# Patient Record
Sex: Female | Born: 1970 | Race: White | Hispanic: No | Marital: Married | Smoking: Current every day smoker
Health system: Southern US, Community
[De-identification: ages and names within clinical notes are randomized; demographics above are authoritative.]

## PROBLEM LIST (undated history)

## (undated) DIAGNOSIS — M549 Dorsalgia, unspecified: Secondary | ICD-10-CM

## (undated) DIAGNOSIS — M1990 Unspecified osteoarthritis, unspecified site: Secondary | ICD-10-CM

## (undated) DIAGNOSIS — F329 Major depressive disorder, single episode, unspecified: Secondary | ICD-10-CM

## (undated) DIAGNOSIS — F419 Anxiety disorder, unspecified: Secondary | ICD-10-CM

## (undated) DIAGNOSIS — F32A Depression, unspecified: Secondary | ICD-10-CM

## (undated) HISTORY — PX: APPENDECTOMY: SHX54

## (undated) HISTORY — PX: DILATION AND CURETTAGE OF UTERUS: SHX78

## (undated) HISTORY — PX: TUBAL LIGATION: SHX77

## (undated) HISTORY — PX: COLONOSCOPY: SHX174

## (undated) HISTORY — PX: CHOLECYSTECTOMY: SHX55

## (undated) HISTORY — PX: WISDOM TOOTH EXTRACTION: SHX21

---

## 2017-02-17 ENCOUNTER — Emergency Department (HOSPITAL_COMMUNITY): Payer: BLUE CROSS/BLUE SHIELD

## 2017-02-17 ENCOUNTER — Encounter (HOSPITAL_COMMUNITY): Payer: Self-pay

## 2017-02-17 ENCOUNTER — Emergency Department (HOSPITAL_COMMUNITY)
Admission: EM | Admit: 2017-02-17 | Discharge: 2017-02-17 | Disposition: A | Discharge status: Home / Self Care | Payer: BLUE CROSS/BLUE SHIELD | Attending: Emergency Medicine | Admitting: Emergency Medicine

## 2017-02-17 DIAGNOSIS — F17200 Nicotine dependence, unspecified, uncomplicated: Secondary | ICD-10-CM | POA: Insufficient documentation

## 2017-02-17 DIAGNOSIS — M545 Low back pain: Secondary | ICD-10-CM | POA: Diagnosis present

## 2017-02-17 DIAGNOSIS — M5441 Lumbago with sciatica, right side: Secondary | ICD-10-CM | POA: Diagnosis not present

## 2017-02-17 HISTORY — DX: Dorsalgia, unspecified: M54.9

## 2017-02-17 MED ORDER — HYDROMORPHONE HCL 1 MG/ML IJ SOLN
1.0000 mg | Freq: Once | INTRAMUSCULAR | Status: AC
  Administered 2017-02-17: 1 mg via INTRAVENOUS

## 2017-02-17 MED ORDER — HYDROMORPHONE HCL 1 MG/ML IJ SOLN
1.0000 mg | Freq: Once | INTRAMUSCULAR | Status: DC
  Filled 2017-02-17: qty 1

## 2017-02-17 MED ORDER — CARISOPRODOL 350 MG PO TABS: 350.0000 mg | ORAL_TABLET | Freq: Three times a day (TID) | ORAL | 0 refills | Status: DC

## 2017-02-17 MED ORDER — DICLOFENAC 18 MG PO CAPS: 1.0000 | ORAL_CAPSULE | Freq: Two times a day (BID) | ORAL | 0 refills | Status: DC | PRN

## 2017-02-17 NOTE — ED Notes (Signed)
Pt returned from MRI and placed back on monitor.  

## 2017-02-17 NOTE — ED Triage Notes (Signed)
Patient complains of ongoing chronic back pain and increased pain to right leg. Has been seeing neuro surgery but no surgery planned as of yet. No new trauma

## 2017-02-17 NOTE — ED Notes (Addendum)
Pt reports issues with lower back. She sates she is in the process of setting up surgery but within the last month her back pain has gotten worse. Pt reports S1, S3-S5. Pt reports issues with bowel incontinence. She also reports 4 falls since February. C/O sharp stabbing pain in right thigh X3 weeks. She reports today at work she had this sharp stabbing pain in her right leg and it contracted backwards. She reports she spoke with MD last Tuesday and is getting MRI and will then get surgery.

## 2017-02-17 NOTE — Consult Note (Signed)
CC:  Chief Complaint  Patient presents with  . back pain/ leg pain    HPI: Kathleen Hansen is a 46 y.o. female presents to ER complaining of continued back pain with radiation down right leg. She was seen in clinic 3/21 by myself and Dr Conchita ParisNundkumar. She reports the pain has not worsened since her office visit. She continues to endorse right leg weakness but denies falls. She does state she has difficulties with urinary incontinence, but not all the time. She denies rectal paresthesia/fecal incontinence. She is currently taking Gabapenin 600mg  QID, Mobic 15mg  daily. She just completed a round of steroids which helped slightly. Her MRI from August 2017 showed L4-5 severe facet arthropathy with anterolisthesis. Moderate canal stenosis with bl L5 impingement in the subarticular recesses. L5-S1 left paracentral disc protrusion with S1 impingement. At her last OV, an MRI and surgery was scheduled - still pending insurance approval.   PMH: Past Medical History:  Diagnosis Date  . Back pain     PSH: History reviewed. No pertinent surgical history.  SH: Social History  Substance Use Topics  . Smoking status: Current Every Day Smoker  . Smokeless tobacco: Never Used  . Alcohol use Not on file    MEDS: Prior to Admission medications   Not on File    ALLERGY: No Known Allergies  ROS: Review of Systems  Constitutional: Negative for chills, fever and weight loss.  HENT: Negative for hearing loss and tinnitus.   Eyes: Negative.   Cardiovascular: Negative.   Gastrointestinal: Positive for nausea (from pain). Negative for constipation, diarrhea, heartburn and vomiting.  Genitourinary: Negative.   Musculoskeletal: Positive for back pain and myalgias. Negative for falls and neck pain.  Skin: Negative.   Neurological: Positive for sensory change (right thigh). Negative for dizziness, tingling, tremors, speech change, focal weakness, seizures, loss of consciousness and headaches.   Endo/Heme/Allergies: Does not bruise/bleed easily.    Vitals:   02/17/17 1217 02/17/17 1422  BP: 135/77 123/77  Pulse: 91 88  Resp: 16 13  Temp: 98.5 F (36.9 C)    General appearance: NAD, resting comfortably Eyes: PERRL, Fundoscopic: normal Cardiovascular: Regular rate and rhythm without murmurs, rubs, gallops. No edema or variciosities. Distal pulses normal. Pulmonary: Clear to auscultation Musculoskeletal:     Muscle tone upper extremities: Normal    Muscle tone lower extremities: Normal    Motor exam: Upper Extremities Deltoid Bicep Tricep Grip  Right 5/5 5/5 5/5 5/5  Left 5/5 5/5 5/5 5/5   Lower Extremity IP Quad PF DF EHL  Right 4+/5 4+/5 4+/5 4+/5 4+/5  Left 5/5 5/5 5/5 5/5 5/5   Neurological Awake, alert, oriented Memory and concentration grossly intact Speech fluent, appropriate CNII: Visual fields normal CNIII/IV/VI: EOMI CNV: Facial sensation normal CNVII: Symmetric, normal strength CNVIII: Grossly normal CNIX: Normal palate movement CNXI: Trap and SCM strength normal CN XII: Tongue protrusion normal Sensation grossly intact to LT DTR: Normal  IMAGING: No recent imaging other than MRI mentioned in HPI  IMPRESSION/PLAN - 46 y.o. female with chronic low back pain with radiculopathy. She unfortunately is in a difficult circumstance where she is still pending approval from insurance for her L-spine MRI and surgery. Based on level of pain and episodes of urinary incontinence, recommend stat MRI L-spine w/o to r/o cauda equina syndrome. Patient was advised that if there is no evidence of CES, that we could trial her on Soma and diclofenac (d/c Mobic), but otherwise, she would be discharged home. She was very understanding  and agreed to this.

## 2017-02-17 NOTE — ED Notes (Signed)
Pt transported to MRI 

## 2017-02-17 NOTE — ED Notes (Signed)
Neuro at bedside.

## 2017-02-17 NOTE — ED Notes (Signed)
Changed dilaudid from IM to IV [facility]

## 2017-02-17 NOTE — ED Notes (Signed)
Wheeled pt back to room from waiting room, pt placed in gown. 

## 2017-02-17 NOTE — ED Provider Notes (Signed)
[facility]-EMERGENCY DEPT Provider Note   CSN: [id number] Arrival date & time: 02/17/17  1204   By signing my name below, I, Kathleen Hansen, attest that this documentation has been prepared under the direction and in the presence of Kathleen Memos, MD. Electronically signed, Kathleen Hansen, ED Scribe. 02/17/17. 4:15 PM.  History   Chief Complaint Chief Complaint  Patient presents with  . back pain/ leg pain   The history is provided by the patient and medical records. No language interpreter was used.    HPI Comments: Kathleen Hansen is a 46 y.o. female who presents to the Emergency Department complaining of 10/10 worsened, constant, chronic back pain x 1 month. She notes associated bowel incontinence, RLE numbness, RLE weakness and 4 falls since 12/2016. Pt reports new, sharp, stabbing and burning pain radiating from the R thigh to the middle of the buttocks, and she adds some R leg cramping and spasms. She states she was seen by a neurology specialist 02/08/2017, but she has not had surgery scheduled yet. She further adds she hs used a TENS unit, done physical therapy exercises and used prescribed steroidal medications without medications. Pt denies recent traumas.  Past Medical History:  Diagnosis Date  . Back pain     There are no active problems to display for this patient.   History reviewed. No pertinent surgical history.  OB History    No data available       Home Medications    Prior to Admission medications   Medication Sig Start Date End Date Taking? Authorizing Provider  carisoprodol (SOMA) 350 MG tablet Take 1 tablet (350 mg total) by mouth 3 (three) times daily. 02/17/17   Kathleen Memos, MD  Diclofenac 18 MG CAPS Take 1 capsule by mouth 2 (two) times daily as needed. 02/17/17   Kathleen Memos, MD    Family History No family history on file.  Social History Social History  Substance Use Topics  . Smoking status: Current Every Day Smoker  . Smokeless tobacco: Never Used    . Alcohol use Not on file     Allergies   Patient has no known allergies.   Review of Systems Review of Systems  Gastrointestinal:       +bowel incontinece  Musculoskeletal: Positive for arthralgias and back pain. Negative for gait problem and joint swelling.  Skin: Negative for wound.  Neurological: Positive for weakness and numbness.  All other systems reviewed and are negative.    Physical Exam Updated Vital Signs BP 123/77 (BP Location: Right Arm)   Pulse 88   Temp 98.5 F (36.9 C) (Oral)   Resp 13   Ht 5\' 2"  (1.575 m)   Wt 241 lb (109.3 kg)   SpO2 97%   BMI 44.08 kg/m   Physical Exam  Constitutional: She is oriented to person, place, and time. She appears well-developed and well-nourished.  HENT:  Head: Normocephalic and atraumatic.  Eyes: Conjunctivae are normal. Pupils are equal, round, and reactive to light. Right eye exhibits no discharge. Left eye exhibits no discharge. No scleral icterus.  Neck: Normal range of motion. No JVD present. No tracheal deviation present.  Cardiovascular: Normal rate and regular rhythm.  Exam reveals no friction rub.   No murmur heard. Pulmonary/Chest: Effort normal and breath sounds normal. No stridor. No respiratory distress.  Musculoskeletal: She exhibits tenderness. She exhibits no deformity.  No midline spinal tenderness; no paraspinal TTP; no step off or deformities; no T or L spine tenderness; NL sensation of  bilateral lower extremities except for mild decreased sensitivity to the R medial thigh; pt has more difficulty lifting the R leg, but she is able to hold it up for ~5 seconds; NL achilles reflex on the R  Neurological: She is alert and oriented to person, place, and time. Coordination normal.  Decreased ability to hold her leg against gravity on the right side. Normal deep tendon reflexes in the right Achilles. Normal DP pulse on the right. Decreased sensation right medial thigh compared to the left. Upper extremity is  normal.  Skin: Skin is warm and dry.  Psychiatric: She has a normal mood and affect. Her behavior is normal. Judgment and thought content normal.  Nursing note and vitals reviewed.    ED Treatments / Results  DIAGNOSTIC STUDIES: Oxygen Saturation is 97% on RA, normal by my interpretation.    COORDINATION OF CARE: 4:02 PM Discussed treatment plan with pt at bedside and pt agreed to plan. Will order medication.  Labs (all labs ordered are listed, but only abnormal results are displayed) Labs Reviewed - No data to display  EKG  EKG Interpretation None       Radiology Mr Lumbar Spine Wo Contrast  Result Date: 02/17/2017 CLINICAL DATA:  Low back and RIGHT leg pain. Evaluate for cauda equina impingement. Episodes of incontinence. EXAM: MRI LUMBAR SPINE WITHOUT CONTRAST TECHNIQUE: Multiplanar, multisequence MR imaging of the lumbar spine was performed. No intravenous contrast was administered. COMPARISON:  Lumbar flexion extension radiographs 02/09/2017 at [facility]. MRI lumbar spine 07/06/2016. FINDINGS: Segmentation:  Standard. Alignment:  2 mm retrolisthesis L2-3.  1 mm anterolisthesis L4-5. Vertebrae:  No worrisome osseous lesion. Conus medullaris: Extends to the L1 level and appears normal. Paraspinal and other soft tissues: 13 mm suspected LEFT adrenal adenoma better seen on the previous exam Disc levels: L1-L2:  Normal disc space.  No impingement. L2-L3: Disc space narrowing. 2 mm retrolisthesis. Annular bulge. Mild facet arthropathy. No impingement. L3-L4:  Normal disc space.  Mild facet arthropathy.  No impingement. L4-L5: 1 mm anterolisthesis. Advanced facet arthropathy with large joint effusions. Ligamentum flavum hypertrophy. Annular bulging/ uncovering of the disc. Moderate stenosis. BILATERAL subarticular zone and borderline foraminal zone narrowing affecting the L5 greater than L4 nerve roots. These changes are not clearly worse on the RIGHT versus LEFT. L5-S1: Disc desiccation. Central  and leftward protrusion. Mild facet arthropathy. LEFT L5 and LEFT S1 nerve root impingement are noted. Compared with 2017 study, the disc protrusion at L5-S1 is worse, extending into the foramen. The appearance at L4-5 is essentially the same. Compared with the standing flexion extension views, anterolisthesis at L4-5 increases 10 mm. Given that degree of slip, and forward flexion, severe stenosis due to dynamic instability would be expected at the L4-5 level. IMPRESSION: 1 mm anterolisthesis L4-5 secondary to severe posterior element hypertrophy. Annular bulge/ uncovering of the disc. Note is made of significant dynamic instability with standing flexion extension views, with increased anterolisthesis L4-5 up to 10 mm. Therefore, standing could result in significant cauda equina compression which is not present on this static supine examination. Central and leftward protrusion L5-S1, worse from 2017. Now LEFT L5, and LEFT S1 nerve root impingement are noted. 2 mm retrolisthesis L2-3 also facet mediated, without impingement. Electronically Signed   By: Elsie Stain M.D.   On: 02/17/2017 19:30    Procedures Procedures (including critical care time)  Medications Ordered in ED Medications  HYDROmorphone (DILAUDID) injection 1 mg (1 mg Intravenous Given 02/17/17 1629)  Initial Impression / Assessment and Plan / ED Course  I have reviewed the triage vital signs and the nursing notes.  Pertinent labs & imaging results that were available during my care of the patient were reviewed by me and considered in my medical decision making (see chart for details).     MRI done per neurosurgery's request. Findings and MRI were directly discussed with neurosurgery physician assistant who stated that he had discussed the case and MRI with the on-call neurosurgeon, Dr. Bevely Palmeritty, and the neurosurgeon felt that these I&D in this were consistent with her chronic findings and were similar to previous MRI. He did not  suggest admission, emergent surgery or brace. They were still plan on doing outpatient surgery which they will talk to her about setting up. I discussed this with the patient and she is okay with that plan and will follow up as an outpatient.  Final Clinical Impressions(s) / ED Diagnoses   Final diagnoses:  Acute right-sided low back pain with right-sided sciatica    New Prescriptions Discharge Medication List as of 02/17/2017  8:14 PM    START taking these medications   Details  carisoprodol (SOMA) 350 MG tablet Take 1 tablet (350 mg total) by mouth 3 (three) times daily., Starting Thu 02/17/2017, Print    Diclofenac 18 MG CAPS Take 1 capsule by mouth 2 (two) times daily as needed., Starting Thu 02/17/2017, Print       I personally performed the services described in this documentation, which was scribed in my presence. The recorded information has been reviewed and is accurate.     Kathleen MemosJason Carling Liberman, MD 02/18/17 1318

## 2017-02-17 NOTE — ED Notes (Signed)
ED Provider at bedside. 

## 2017-02-21 ENCOUNTER — Other Ambulatory Visit: Payer: Self-pay | Admitting: Neurosurgery

## 2017-02-24 ENCOUNTER — Other Ambulatory Visit (HOSPITAL_COMMUNITY): Payer: Self-pay | Admitting: Neurosurgery

## 2017-02-24 DIAGNOSIS — M4316 Spondylolisthesis, lumbar region: Secondary | ICD-10-CM

## 2017-03-04 ENCOUNTER — Ambulatory Visit (HOSPITAL_COMMUNITY)
Admission: RE | Admit: 2017-03-04 | Discharge: 2017-03-04 | Disposition: A | Discharge status: Home / Self Care | Payer: BLUE CROSS/BLUE SHIELD | Source: Ambulatory Visit | Attending: Neurosurgery | Admitting: Neurosurgery

## 2017-03-04 DIAGNOSIS — M48061 Spinal stenosis, lumbar region without neurogenic claudication: Secondary | ICD-10-CM | POA: Diagnosis not present

## 2017-03-04 DIAGNOSIS — M4316 Spondylolisthesis, lumbar region: Secondary | ICD-10-CM | POA: Insufficient documentation

## 2017-03-04 DIAGNOSIS — M2578 Osteophyte, vertebrae: Secondary | ICD-10-CM | POA: Insufficient documentation

## 2017-03-08 ENCOUNTER — Encounter (HOSPITAL_COMMUNITY): Payer: Self-pay | Admitting: *Deleted

## 2017-03-08 ENCOUNTER — Encounter (HOSPITAL_COMMUNITY)
Admission: RE | Admit: 2017-03-08 | Discharge: 2017-03-08 | Disposition: A | Discharge status: Home / Self Care | Payer: BLUE CROSS/BLUE SHIELD | Source: Ambulatory Visit | Attending: Neurosurgery | Admitting: Neurosurgery

## 2017-03-08 DIAGNOSIS — M1990 Unspecified osteoarthritis, unspecified site: Secondary | ICD-10-CM | POA: Diagnosis not present

## 2017-03-08 DIAGNOSIS — F329 Major depressive disorder, single episode, unspecified: Secondary | ICD-10-CM | POA: Diagnosis not present

## 2017-03-08 DIAGNOSIS — Z01812 Encounter for preprocedural laboratory examination: Secondary | ICD-10-CM | POA: Diagnosis not present

## 2017-03-08 DIAGNOSIS — M549 Dorsalgia, unspecified: Secondary | ICD-10-CM | POA: Insufficient documentation

## 2017-03-08 DIAGNOSIS — F419 Anxiety disorder, unspecified: Secondary | ICD-10-CM | POA: Diagnosis not present

## 2017-03-08 HISTORY — DX: Major depressive disorder, single episode, unspecified: F32.9

## 2017-03-08 HISTORY — DX: Depression, unspecified: F32.A

## 2017-03-08 HISTORY — DX: Anxiety disorder, unspecified: F41.9

## 2017-03-08 HISTORY — DX: Unspecified osteoarthritis, unspecified site: M19.90

## 2017-03-08 LAB — BASIC METABOLIC PANEL
ANION GAP: 11 (ref 5–15)
BUN: <5 mg/dL — ABNORMAL LOW (ref 6–20)
CHLORIDE: 103 mmol/L (ref 101–111)
CO2: 26 mmol/L (ref 22–32)
Calcium: 9.1 mg/dL (ref 8.9–10.3)
Creatinine, Ser: 0.74 mg/dL (ref 0.44–1.00)
GFR calc Af Amer: >60 mL/min (ref >60)
GFR calc non Af Amer: >60 mL/min (ref >60)
Glucose, Bld: 98 mg/dL (ref 65–99)
POTASSIUM: 3.8 mmol/L (ref 3.5–5.1)
SODIUM: 140 mmol/L (ref 135–145)

## 2017-03-08 LAB — CBC
HCT: 43.9 % (ref 36.0–46.0)
HEMOGLOBIN: 14.8 g/dL (ref 12.0–15.0)
MCH: 31.7 pg (ref 26.0–34.0)
MCHC: 33.7 g/dL (ref 30.0–36.0)
MCV: 94 fL (ref 78.0–100.0)
Platelets: 352 10*3/uL (ref 150–400)
RBC: 4.67 MIL/uL (ref 3.87–5.11)
RDW: 13.5 % (ref 11.5–15.5)
WBC: 8.6 10*3/uL (ref 4.0–10.5)

## 2017-03-08 LAB — TYPE AND SCREEN
ABO/RH(D): A POS
ANTIBODY SCREEN: NEGATIVE

## 2017-03-08 LAB — SURGICAL PCR SCREEN
MRSA, PCR: NEGATIVE
Staphylococcus aureus: NEGATIVE

## 2017-03-08 LAB — ABO/RH: ABO/RH(D): A POS

## 2017-03-08 LAB — HCG, SERUM, QUALITATIVE: Preg, Serum: NEGATIVE

## 2017-03-08 NOTE — Progress Notes (Signed)
PCP - [facility]  Cardiologist - denies  Chest x-ray - not needed EKG - no heart history Stress Test - denies ECHO - denies Cardiac Cath - denies      Patient denies shortness of breath, fever, cough and chest pain at PAT appointment   Patient verbalized understanding of instructions that was given to them at the PAT appointment. Patient expressed that there were no further questions.  Patient was also instructed that they will need to review over the PAT instructions again at home before the surgery.

## 2017-03-08 NOTE — Pre-Procedure Instructions (Signed)
Kathleen Hansen  03/08/2017      [id number] - [place], [place] - [address] AT [facility] [address] [place] [address] Phone: [phone] Fax: [phone]    Your procedure is scheduled on April 24  Report to [facility] Admitting at [employer] A.M.  Call this number if you have problems the morning of surgery:  [phone]   Remember:  Do not eat food or drink liquids after midnight.   Take these medicines the morning of surgery with A SIP OF WATER DULoxetine (CYMBALTA), gabapentin (NEURONTIN), eye drops if needed, sertraline (ZOLOFT)   Take all other medications as prescribed except 7 days prior to surgery STOP taking any MOBIC, Aspirin, Aleve, Naproxen, Ibuprofen, Motrin, Advil, Goody's, BC's, all herbal medications, fish oil, and all vitamins    Do not wear jewelry, make-up or nail polish.  Do not wear lotions, powders, or perfumes, or deoderant.  Do not shave 48 hours prior to surgery.  Men may shave face and neck.  Do not bring valuables to the hospital.  [facility] is not responsible for any belongings or valuables.  Contacts, dentures or bridgework may not be worn into surgery.  Leave your suitcase in the car.  After surgery it may be brought to your room.  For patients admitted to the hospital, discharge time will be determined by your treatment team.  Patients discharged the day of surgery will not be allowed to drive home.    Special instructions:   [place]- Preparing For Surgery  Before surgery, you can play an important role. Because skin is not sterile, your skin needs to be as free of germs as possible. You can reduce the number of germs on your skin by washing with CHG (chlorahexidine gluconate) Soap before surgery.  CHG is an antiseptic cleaner which kills germs and bonds with the skin to continue killing germs even after washing.  Please do not use if you have an allergy to CHG or antibacterial soaps. If your skin  becomes reddened/irritated stop using the CHG.  Do not shave (including legs and underarms) for at least 48 hours prior to first CHG shower. It is OK to shave your face.  Please follow these instructions carefully.   1. Shower the NIGHT BEFORE SURGERY and the MORNING OF SURGERY with CHG.   2. If you chose to wash your hair, wash your hair first as usual with your normal shampoo.  3. After you shampoo, rinse your hair and body thoroughly to remove the shampoo.  4. Use CHG as you would any other liquid soap. You can apply CHG directly to the skin and wash gently with a scrungie or a clean washcloth.   5. Apply the CHG Soap to your body ONLY FROM THE NECK DOWN.  Do not use on open wounds or open sores. Avoid contact with your eyes, ears, mouth and genitals (private parts). Wash genitals (private parts) with your normal soap.  6. Wash thoroughly, paying special attention to the area where your surgery will be performed.  7. Thoroughly rinse your body with warm water from the neck down.  8. DO NOT shower/wash with your normal soap after using and rinsing off the CHG Soap.  9. Pat yourself dry with a CLEAN TOWEL.   10. Wear CLEAN PAJAMAS   11. Place CLEAN SHEETS on your bed the night of your first shower and DO NOT SLEEP WITH PETS.    Day of  Surgery: Do not apply any deodorants/lotions. Please wear clean clothes to the hospital/surgery center.      Please read over the following fact sheets that you were given.

## 2017-03-15 ENCOUNTER — Inpatient Hospital Stay (HOSPITAL_COMMUNITY)
Admission: RE | Admit: 2017-03-15 | Discharge: 2017-03-16 | DRG: 455 | Disposition: A | Discharge status: Home / Self Care | Payer: BLUE CROSS/BLUE SHIELD | Source: Ambulatory Visit | Attending: Neurosurgery | Admitting: Neurosurgery

## 2017-03-15 ENCOUNTER — Inpatient Hospital Stay (HOSPITAL_COMMUNITY): Payer: BLUE CROSS/BLUE SHIELD | Admitting: Certified Registered Nurse Anesthetist

## 2017-03-15 ENCOUNTER — Inpatient Hospital Stay (HOSPITAL_COMMUNITY): Payer: BLUE CROSS/BLUE SHIELD

## 2017-03-15 ENCOUNTER — Encounter (HOSPITAL_COMMUNITY): Payer: Self-pay | Admitting: Certified Registered Nurse Anesthetist

## 2017-03-15 ENCOUNTER — Encounter (HOSPITAL_COMMUNITY)
Admission: RE | Disposition: A | Discharge status: Home / Self Care | Payer: Self-pay | Source: Ambulatory Visit | Attending: Neurosurgery

## 2017-03-15 DIAGNOSIS — Z79899 Other long term (current) drug therapy: Secondary | ICD-10-CM

## 2017-03-15 DIAGNOSIS — Z419 Encounter for procedure for purposes other than remedying health state, unspecified: Secondary | ICD-10-CM

## 2017-03-15 DIAGNOSIS — M5416 Radiculopathy, lumbar region: Secondary | ICD-10-CM | POA: Diagnosis present

## 2017-03-15 DIAGNOSIS — Z7982 Long term (current) use of aspirin: Secondary | ICD-10-CM | POA: Diagnosis not present

## 2017-03-15 DIAGNOSIS — R200 Anesthesia of skin: Secondary | ICD-10-CM | POA: Diagnosis present

## 2017-03-15 DIAGNOSIS — M4316 Spondylolisthesis, lumbar region: Principal | ICD-10-CM

## 2017-03-15 DIAGNOSIS — Z791 Long term (current) use of non-steroidal anti-inflammatories (NSAID): Secondary | ICD-10-CM

## 2017-03-15 DIAGNOSIS — F17210 Nicotine dependence, cigarettes, uncomplicated: Secondary | ICD-10-CM | POA: Diagnosis present

## 2017-03-15 DIAGNOSIS — M1991 Primary osteoarthritis, unspecified site: Secondary | ICD-10-CM | POA: Diagnosis present

## 2017-03-15 DIAGNOSIS — M545 Low back pain: Secondary | ICD-10-CM | POA: Diagnosis present

## 2017-03-15 HISTORY — PX: LUMBAR PERCUTANEOUS PEDICLE SCREW 1 LEVEL: SHX5560

## 2017-03-15 HISTORY — PX: ANTERIOR LAT LUMBAR FUSION: SHX1168

## 2017-03-15 HISTORY — PX: APPLICATION OF ROBOTIC ASSISTANCE FOR SPINAL PROCEDURE: SHX6753

## 2017-03-15 LAB — I-STAT BETA HCG BLOOD, ED (NOT ORDERABLE)

## 2017-03-15 SURGERY — ANTERIOR LATERAL LUMBAR FUSION 1 LEVEL
Anesthesia: General

## 2017-03-15 MED ORDER — DEXAMETHASONE SODIUM PHOSPHATE 10 MG/ML IJ SOLN
INTRAMUSCULAR | Status: AC
  Filled 2017-03-15: qty 1

## 2017-03-15 MED ORDER — PROPOFOL 10 MG/ML IV BOLUS
INTRAVENOUS | Status: AC
Start: 2017-03-15 — End: 2017-03-15
  Filled 2017-03-15: qty 40

## 2017-03-15 MED ORDER — SODIUM CHLORIDE 0.9% FLUSH
3.0000 mL | Freq: Two times a day (BID) | INTRAVENOUS | Status: DC
  Administered 2017-03-15: 3 mL via INTRAVENOUS

## 2017-03-15 MED ORDER — HEMOSTATIC AGENTS (NO CHARGE) OPTIME
TOPICAL | Status: DC | PRN
  Administered 2017-03-15: 1 via TOPICAL

## 2017-03-15 MED ORDER — SENNA 8.6 MG PO TABS
1.0000 | ORAL_TABLET | Freq: Two times a day (BID) | ORAL | Status: DC
  Administered 2017-03-15 – 2017-03-16 (×2): 8.6 mg via ORAL
  Filled 2017-03-15 (×2): qty 1

## 2017-03-15 MED ORDER — SODIUM CHLORIDE 0.9 % IV SOLN: INTRAVENOUS | Status: DC

## 2017-03-15 MED ORDER — ROCURONIUM BROMIDE 10 MG/ML (PF) SYRINGE
PREFILLED_SYRINGE | INTRAVENOUS | Status: AC
  Filled 2017-03-15: qty 5

## 2017-03-15 MED ORDER — SERTRALINE HCL 50 MG PO TABS
100.0000 mg | ORAL_TABLET | Freq: Every day | ORAL | Status: DC
Start: 2017-03-16 — End: 2017-03-16
  Administered 2017-03-16: 100 mg via ORAL
  Filled 2017-03-15: qty 2

## 2017-03-15 MED ORDER — ONDANSETRON HCL 4 MG/2ML IJ SOLN: 4.0000 mg | Freq: Four times a day (QID) | INTRAMUSCULAR | Status: DC | PRN

## 2017-03-15 MED ORDER — SUFENTANIL CITRATE 50 MCG/ML IV SOLN
INTRAVENOUS | Status: AC
  Administered 2017-03-15: 6.6 mL/h via INTRAVENOUS

## 2017-03-15 MED ORDER — CEFAZOLIN SODIUM-DEXTROSE 2-4 GM/100ML-% IV SOLN
2.0000 g | Freq: Three times a day (TID) | INTRAVENOUS | Status: AC
  Administered 2017-03-15 – 2017-03-16 (×2): 2 g via INTRAVENOUS
  Filled 2017-03-15 (×2): qty 100

## 2017-03-15 MED ORDER — ROCURONIUM BROMIDE 10 MG/ML (PF) SYRINGE: PREFILLED_SYRINGE | INTRAVENOUS | Status: DC | PRN

## 2017-03-15 MED ORDER — KETOROLAC TROMETHAMINE 15 MG/ML IJ SOLN
15.0000 mg | Freq: Four times a day (QID) | INTRAMUSCULAR | Status: DC
  Administered 2017-03-15 – 2017-03-16 (×3): 15 mg via INTRAVENOUS
  Filled 2017-03-15 (×3): qty 1

## 2017-03-15 MED ORDER — ONDANSETRON HCL 4 MG PO TABS: 4.0000 mg | ORAL_TABLET | Freq: Four times a day (QID) | ORAL | Status: DC | PRN

## 2017-03-15 MED ORDER — FENTANYL CITRATE (PF) 100 MCG/2ML IJ SOLN
25.0000 ug | INTRAMUSCULAR | Status: DC | PRN
  Administered 2017-03-15 (×3): 50 ug via INTRAVENOUS

## 2017-03-15 MED ORDER — THROMBIN 5000 UNITS EX SOLR
OROMUCOSAL | Status: DC | PRN
  Administered 2017-03-15: 07:00:00 via TOPICAL

## 2017-03-15 MED ORDER — SUGAMMADEX SODIUM 500 MG/5ML IV SOLN
INTRAVENOUS | Status: AC
  Filled 2017-03-15: qty 5

## 2017-03-15 MED ORDER — MIDAZOLAM HCL 2 MG/2ML IJ SOLN
INTRAMUSCULAR | Status: AC
  Filled 2017-03-15: qty 2

## 2017-03-15 MED ORDER — PROPOFOL 10 MG/ML IV BOLUS
INTRAVENOUS | Status: DC | PRN
  Administered 2017-03-15: 30 mg via INTRAVENOUS
  Administered 2017-03-15: 150 mg via INTRAVENOUS

## 2017-03-15 MED ORDER — IPRATROPIUM BROMIDE HFA 17 MCG/ACT IN AERS
INHALATION_SPRAY | RESPIRATORY_TRACT | Status: DC | PRN
  Administered 2017-03-15: 4 via RESPIRATORY_TRACT

## 2017-03-15 MED ORDER — FENTANYL CITRATE (PF) 100 MCG/2ML IJ SOLN
INTRAMUSCULAR | Status: AC
  Filled 2017-03-15: qty 2

## 2017-03-15 MED ORDER — CEFAZOLIN SODIUM 1 G IJ SOLR
INTRAMUSCULAR | Status: AC
  Filled 2017-03-15: qty 20

## 2017-03-15 MED ORDER — MENTHOL 3 MG MT LOZG
1.0000 | LOZENGE | OROMUCOSAL | Status: DC | PRN
Start: 2017-03-15 — End: 2017-03-16

## 2017-03-15 MED ORDER — THROMBIN 5000 UNITS EX SOLR
CUTANEOUS | Status: DC | PRN
  Administered 2017-03-15 (×2): 5000 [IU] via TOPICAL

## 2017-03-15 MED ORDER — ALBUMIN HUMAN 5 % IV SOLN
INTRAVENOUS | Status: DC | PRN
  Administered 2017-03-15: 11:00:00 via INTRAVENOUS

## 2017-03-15 MED ORDER — GABAPENTIN 300 MG PO CAPS
600.0000 mg | ORAL_CAPSULE | Freq: Four times a day (QID) | ORAL | Status: DC
  Administered 2017-03-15 – 2017-03-16 (×3): 600 mg via ORAL
  Filled 2017-03-15 (×3): qty 2

## 2017-03-15 MED ORDER — LIDOCAINE-EPINEPHRINE 1 %-1:100000 IJ SOLN
INTRAMUSCULAR | Status: DC | PRN
  Administered 2017-03-15: 18 mL
  Administered 2017-03-15: 7 mL

## 2017-03-15 MED ORDER — FENTANYL CITRATE (PF) 250 MCG/5ML IJ SOLN
INTRAMUSCULAR | Status: AC
  Filled 2017-03-15: qty 5

## 2017-03-15 MED ORDER — PHENYLEPHRINE HCL 10 MG/ML IJ SOLN
INTRAVENOUS | Status: DC | PRN
  Administered 2017-03-15: 35 ug/min via INTRAVENOUS

## 2017-03-15 MED ORDER — PHENYLEPHRINE 40 MCG/ML (10ML) SYRINGE FOR IV PUSH (FOR BLOOD PRESSURE SUPPORT)
PREFILLED_SYRINGE | INTRAVENOUS | Status: AC
  Filled 2017-03-15: qty 10

## 2017-03-15 MED ORDER — LIDOCAINE-EPINEPHRINE 1 %-1:100000 IJ SOLN
INTRAMUSCULAR | Status: AC
  Filled 2017-03-15: qty 1

## 2017-03-15 MED ORDER — CHLORHEXIDINE GLUCONATE CLOTH 2 % EX PADS: 6.0000 | MEDICATED_PAD | Freq: Once | CUTANEOUS | Status: DC

## 2017-03-15 MED ORDER — HEPARIN SODIUM (PORCINE) 5000 UNIT/ML IJ SOLN: 5000.0000 [IU] | Freq: Three times a day (TID) | INTRAMUSCULAR | Status: DC

## 2017-03-15 MED ORDER — CARISOPRODOL 350 MG PO TABS: 350.0000 mg | ORAL_TABLET | Freq: Three times a day (TID) | ORAL | Status: DC | PRN

## 2017-03-15 MED ORDER — BUPIVACAINE HCL (PF) 0.5 % IJ SOLN
INTRAMUSCULAR | Status: DC | PRN
Start: 2017-03-15 — End: 2017-03-15
  Administered 2017-03-15: 7 mL
  Administered 2017-03-15: 18 mL

## 2017-03-15 MED ORDER — LIDOCAINE 2% (20 MG/ML) 5 ML SYRINGE
INTRAMUSCULAR | Status: AC
  Filled 2017-03-15: qty 5

## 2017-03-15 MED ORDER — FENTANYL CITRATE (PF) 100 MCG/2ML IJ SOLN
INTRAMUSCULAR | Status: AC
  Administered 2017-03-15: 50 ug via INTRAVENOUS
  Filled 2017-03-15: qty 2

## 2017-03-15 MED ORDER — ACETAMINOPHEN 650 MG RE SUPP: 650.0000 mg | RECTAL | Status: DC | PRN

## 2017-03-15 MED ORDER — SUFENTANIL CITRATE 50 MCG/ML IV SOLN
INTRAVENOUS | Status: AC
  Filled 2017-03-15: qty 2

## 2017-03-15 MED ORDER — DOCUSATE SODIUM 100 MG PO CAPS
100.0000 mg | ORAL_CAPSULE | Freq: Two times a day (BID) | ORAL | Status: DC
  Administered 2017-03-15 – 2017-03-16 (×2): 100 mg via ORAL
  Filled 2017-03-15 (×2): qty 1

## 2017-03-15 MED ORDER — DULOXETINE HCL 30 MG PO CPEP
90.0000 mg | ORAL_CAPSULE | Freq: Every day | ORAL | Status: DC
Start: 2017-03-16 — End: 2017-03-16
  Administered 2017-03-16: 90 mg via ORAL
  Filled 2017-03-15: qty 3

## 2017-03-15 MED ORDER — SUCCINYLCHOLINE CHLORIDE 200 MG/10ML IV SOSY
PREFILLED_SYRINGE | INTRAVENOUS | Status: DC | PRN
  Administered 2017-03-15: 100 mg via INTRAVENOUS

## 2017-03-15 MED ORDER — ACETAMINOPHEN 325 MG PO TABS: 650.0000 mg | ORAL_TABLET | ORAL | Status: DC | PRN

## 2017-03-15 MED ORDER — 0.9 % SODIUM CHLORIDE (POUR BTL) OPTIME
TOPICAL | Status: DC | PRN
  Administered 2017-03-15: 1000 mL

## 2017-03-15 MED ORDER — CEFAZOLIN SODIUM-DEXTROSE 2-4 GM/100ML-% IV SOLN
2.0000 g | INTRAVENOUS | Status: AC
  Administered 2017-03-15 (×2): 2 g via INTRAVENOUS

## 2017-03-15 MED ORDER — SUGAMMADEX SODIUM 500 MG/5ML IV SOLN
INTRAVENOUS | Status: DC | PRN
  Administered 2017-03-15: 216 mg via INTRAVENOUS

## 2017-03-15 MED ORDER — HYPROMELLOSE (GONIOSCOPIC) 2.5 % OP SOLN
1.0000 [drp] | Freq: Four times a day (QID) | OPHTHALMIC | Status: DC | PRN
  Filled 2017-03-15: qty 15

## 2017-03-15 MED ORDER — THROMBIN 5000 UNITS EX SOLR
CUTANEOUS | Status: AC
  Filled 2017-03-15: qty 15000

## 2017-03-15 MED ORDER — OXYCODONE HCL 5 MG PO TABS
ORAL_TABLET | ORAL | Status: AC
  Filled 2017-03-15: qty 2

## 2017-03-15 MED ORDER — SODIUM CHLORIDE 0.9 % IR SOLN
Status: DC | PRN
  Administered 2017-03-15: 08:00:00

## 2017-03-15 MED ORDER — FENTANYL CITRATE (PF) 100 MCG/2ML IJ SOLN: 25.0000 ug | INTRAMUSCULAR | Status: DC | PRN

## 2017-03-15 MED ORDER — SUCCINYLCHOLINE CHLORIDE 200 MG/10ML IV SOSY
PREFILLED_SYRINGE | INTRAVENOUS | Status: AC
  Filled 2017-03-15: qty 10

## 2017-03-15 MED ORDER — BUPIVACAINE HCL (PF) 0.5 % IJ SOLN
INTRAMUSCULAR | Status: AC
  Filled 2017-03-15: qty 30

## 2017-03-15 MED ORDER — EPHEDRINE SULFATE-NACL 50-0.9 MG/10ML-% IV SOSY
PREFILLED_SYRINGE | INTRAVENOUS | Status: DC | PRN
Start: 2017-03-15 — End: 2017-03-15
  Administered 2017-03-15: 120 mg via INTRAVENOUS
  Administered 2017-03-15: 80 mg via INTRAVENOUS

## 2017-03-15 MED ORDER — LACTATED RINGERS IV SOLN
INTRAVENOUS | Status: DC | PRN
  Administered 2017-03-15 (×2): via INTRAVENOUS

## 2017-03-15 MED ORDER — GABAPENTIN 600 MG PO TABS: 600.0000 mg | ORAL_TABLET | Freq: Four times a day (QID) | ORAL | Status: DC

## 2017-03-15 MED ORDER — LACTATED RINGERS IV SOLN
INTRAVENOUS | Status: DC | PRN
  Administered 2017-03-15 (×2): via INTRAVENOUS

## 2017-03-15 MED ORDER — ONDANSETRON HCL 4 MG/2ML IJ SOLN
INTRAMUSCULAR | Status: DC | PRN
  Administered 2017-03-15: 4 mg via INTRAVENOUS

## 2017-03-15 MED ORDER — PROPOFOL 500 MG/50ML IV EMUL
INTRAVENOUS | Status: DC | PRN
  Administered 2017-03-15: 75 ug/kg/min via INTRAVENOUS

## 2017-03-15 MED ORDER — PHENOL 1.4 % MT LIQD: 1.0000 | OROMUCOSAL | Status: DC | PRN

## 2017-03-15 MED ORDER — OXYCODONE HCL 5 MG PO TABS
5.0000 mg | ORAL_TABLET | ORAL | Status: DC | PRN
  Administered 2017-03-15 – 2017-03-16 (×5): 10 mg via ORAL
  Filled 2017-03-15 (×4): qty 2

## 2017-03-15 MED ORDER — ONDANSETRON HCL 4 MG/2ML IJ SOLN
INTRAMUSCULAR | Status: AC
  Filled 2017-03-15: qty 2

## 2017-03-15 MED ORDER — BISACODYL 10 MG RE SUPP: 10.0000 mg | Freq: Every day | RECTAL | Status: DC | PRN

## 2017-03-15 MED ORDER — PHENYLEPHRINE 40 MCG/ML (10ML) SYRINGE FOR IV PUSH (FOR BLOOD PRESSURE SUPPORT)
PREFILLED_SYRINGE | INTRAVENOUS | Status: DC | PRN
  Administered 2017-03-15 (×3): 40 ug via INTRAVENOUS

## 2017-03-15 MED ORDER — SODIUM CHLORIDE 0.9% FLUSH: 3.0000 mL | INTRAVENOUS | Status: DC | PRN

## 2017-03-15 MED ORDER — DEXAMETHASONE SODIUM PHOSPHATE 10 MG/ML IJ SOLN
INTRAMUSCULAR | Status: DC | PRN
  Administered 2017-03-15: 5 mg via INTRAVENOUS

## 2017-03-15 MED ORDER — LIDOCAINE 2% (20 MG/ML) 5 ML SYRINGE
INTRAMUSCULAR | Status: DC | PRN
  Administered 2017-03-15: 100 mg via INTRAVENOUS

## 2017-03-15 MED ORDER — FENTANYL CITRATE (PF) 100 MCG/2ML IJ SOLN
INTRAMUSCULAR | Status: DC | PRN
  Administered 2017-03-15: 50 ug via INTRAVENOUS
  Administered 2017-03-15: 100 ug via INTRAVENOUS
  Administered 2017-03-15: 50 ug via INTRAVENOUS
  Administered 2017-03-15: 150 ug via INTRAVENOUS
  Administered 2017-03-15: 50 ug via INTRAVENOUS

## 2017-03-15 MED ORDER — SODIUM CHLORIDE 0.9 % IV SOLN: 250.0000 mL | INTRAVENOUS | Status: DC

## 2017-03-15 MED ORDER — DULOXETINE HCL 30 MG PO CPEP: 30.0000 mg | ORAL_CAPSULE | Freq: Every day | ORAL | Status: DC

## 2017-03-15 MED ORDER — ROCURONIUM BROMIDE 10 MG/ML (PF) SYRINGE
PREFILLED_SYRINGE | INTRAVENOUS | Status: DC | PRN
  Administered 2017-03-15: 50 mg via INTRAVENOUS
  Administered 2017-03-15: 30 mg via INTRAVENOUS

## 2017-03-15 MED ORDER — SODIUM CHLORIDE 0.9 % IJ SOLN
INTRAMUSCULAR | Status: AC
  Filled 2017-03-15: qty 40

## 2017-03-15 MED ORDER — PANTOPRAZOLE SODIUM 40 MG IV SOLR
40.0000 mg | Freq: Every day | INTRAVENOUS | Status: DC
  Administered 2017-03-15: 40 mg via INTRAVENOUS
  Filled 2017-03-15: qty 40

## 2017-03-15 MED ORDER — MIDAZOLAM HCL 5 MG/5ML IJ SOLN
INTRAMUSCULAR | Status: DC | PRN
  Administered 2017-03-15 (×2): 2 mg via INTRAVENOUS

## 2017-03-15 MED ORDER — CEFAZOLIN SODIUM-DEXTROSE 2-4 GM/100ML-% IV SOLN
INTRAVENOUS | Status: AC
  Filled 2017-03-15: qty 100

## 2017-03-15 SURGICAL SUPPLY — 110 items
5.3 PLASTIC DILATOR ×2 IMPLANT
BAG DECANTER FOR FLEXI CONT (MISCELLANEOUS) ×3 IMPLANT
BALL TIP PROBE ×2 IMPLANT
BAYONET BIPOLAR FORCEPS STR TIP 190MM ×2 IMPLANT
BENZOIN TINCTURE PRP APPL 2/3 (GAUZE/BANDAGES/DRESSINGS) IMPLANT
BIT DRILL LONG 3.0X30 (BIT) ×2 IMPLANT
BIT DRILL LONG 3X80 (BIT) IMPLANT
BIT DRILL LONG 4X80 (BIT) IMPLANT
BIT DRILL SHORT 3.0X30 (BIT) IMPLANT
BIT DRILL SHORT 3X80 (BIT) IMPLANT
BLADE CLIPPER SURG (BLADE) IMPLANT
BLADE SURG 11 STRL SS (BLADE) ×6 IMPLANT
BUR MATCHSTICK NEURO 3.0 LAGG (BURR) ×3 IMPLANT
BUR PRECISION FLUTE 5.0 (BURR) ×3 IMPLANT
CANISTER SUCT 3000ML PPV (MISCELLANEOUS) ×3 IMPLANT
CARTRIDGE OIL MAESTRO DRILL (MISCELLANEOUS) ×2 IMPLANT
CATH ROBINSON RED A/P 14FR (CATHETERS) ×3 IMPLANT
CLOSURE WOUND 1/2 X4 (GAUZE/BANDAGES/DRESSINGS)
CONT SPEC 4OZ CLIKSEAL STRL BL (MISCELLANEOUS) ×3 IMPLANT
COVER BACK TABLE 24X17X13 BIG (DRAPES) IMPLANT
COVER BACK TABLE 60X90IN (DRAPES) ×6 IMPLANT
DECANTER SPIKE VIAL GLASS SM (MISCELLANEOUS) ×6 IMPLANT
DERMABOND ADVANCED (GAUZE/BANDAGES/DRESSINGS) ×6
DERMABOND ADVANCED .7 DNX12 (GAUZE/BANDAGES/DRESSINGS) ×3 IMPLANT
DIFFUSER DRILL AIR PNEUMATIC (MISCELLANEOUS) ×3 IMPLANT
DILATOR 5.3MM NERVE STIMULATOR (INSTRUMENTS) ×2 IMPLANT
DISSECTOR BLUNT TIP ENDO 5MM (MISCELLANEOUS) ×6 IMPLANT
DRAPE C-ARM 42X72 X-RAY (DRAPES) ×12 IMPLANT
DRAPE C-ARMOR (DRAPES) ×6 IMPLANT
DRAPE HALF SHEET 40X57 (DRAPES) ×6 IMPLANT
DRAPE LAPAROTOMY 100X72X124 (DRAPES) ×6 IMPLANT
DRAPE POUCH INSTRU U-SHP 10X18 (DRAPES) ×6 IMPLANT
DRAPE SHEET LG 3/4 BI-LAMINATE (DRAPES) ×3 IMPLANT
DRAPE SURG 17X23 STRL (DRAPES) ×3 IMPLANT
DRSG OPSITE POSTOP 3X4 (GAUZE/BANDAGES/DRESSINGS) ×9 IMPLANT
DURAPREP 26ML APPLICATOR (WOUND CARE) ×6 IMPLANT
ELECT BLADE 4.0 EZ CLEAN MEGAD (MISCELLANEOUS) ×6
ELECT REM PT RETURN 9FT ADLT (ELECTROSURGICAL) ×6
ELECTRODE BLDE 4.0 EZ CLN MEGD (MISCELLANEOUS) ×2 IMPLANT
ELECTRODE REM PT RTRN 9FT ADLT (ELECTROSURGICAL) ×2 IMPLANT
FEE INTRAOP MONITOR IMPULS NCS (MISCELLANEOUS) ×1 IMPLANT
FORCEPS BIPOLAR STRT TIP BAYO (INSTRUMENTS) ×2 IMPLANT
GAUZE SPONGE 4X4 12PLY STRL (GAUZE/BANDAGES/DRESSINGS) IMPLANT
GAUZE SPONGE 4X4 16PLY XRAY LF (GAUZE/BANDAGES/DRESSINGS) ×3 IMPLANT
GLOVE BIO SURGEON STRL SZ7 (GLOVE) IMPLANT
GLOVE BIO SURGEON STRL SZ7.5 (GLOVE) ×9 IMPLANT
GLOVE BIOGEL PI IND STRL 7.0 (GLOVE) IMPLANT
GLOVE BIOGEL PI IND STRL 7.5 (GLOVE) ×2 IMPLANT
GLOVE BIOGEL PI INDICATOR 7.0 (GLOVE)
GLOVE BIOGEL PI INDICATOR 7.5 (GLOVE) ×4
GLOVE ECLIPSE 7.0 STRL STRAW (GLOVE) ×6 IMPLANT
GLOVE ECLIPSE 9.0 STRL (GLOVE) ×3 IMPLANT
GLOVE EXAM NITRILE LRG STRL (GLOVE) IMPLANT
GLOVE EXAM NITRILE XL STR (GLOVE) IMPLANT
GLOVE EXAM NITRILE XS STR PU (GLOVE) IMPLANT
GLOVE INDICATOR 7.0 STRL GRN (GLOVE) ×9 IMPLANT
GLOVE INDICATOR 7.5 STRL GRN (GLOVE) ×12 IMPLANT
GLOVE SURG SS PI 6.5 STRL IVOR (GLOVE) ×15 IMPLANT
GOWN STRL REUS W/ TWL LRG LVL3 (GOWN DISPOSABLE) ×6 IMPLANT
GOWN STRL REUS W/ TWL XL LVL3 (GOWN DISPOSABLE) ×1 IMPLANT
GOWN STRL REUS W/TWL 2XL LVL3 (GOWN DISPOSABLE) IMPLANT
GOWN STRL REUS W/TWL LRG LVL3 (GOWN DISPOSABLE) ×12
GOWN STRL REUS W/TWL XL LVL3 (GOWN DISPOSABLE) ×2
GUIDEWIRE 18IN BLUNT CD HORIZ (WIRE) ×8 IMPLANT
HEMOSTAT POWDER KIT SURGIFOAM (HEMOSTASIS) ×6 IMPLANT
HEMOSTAT POWDER SURGIFOAM 1G (HEMOSTASIS) ×6 IMPLANT
INTRAOP MONITOR FEE IMPULS NCS (MISCELLANEOUS) ×1
INTRAOP MONITOR FEE IMPULSE (MISCELLANEOUS) ×2
KIT BASIN OR (CUSTOM PROCEDURE TRAY) ×6 IMPLANT
KIT INFUSE XX SMALL 0.7CC (Orthopedic Implant) ×3 IMPLANT
KIT ROOM TURNOVER OR (KITS) ×6 IMPLANT
KIT SPINE MAZOR X ROBO DISP (MISCELLANEOUS) ×2 IMPLANT
KNIFE SURG 188MM BAYONET LONG (INSTRUMENTS) ×2 IMPLANT
LIGHT SOURCE STRAIGHT TIP (INSTRUMENTS) ×3 IMPLANT
NEEDLE HYPO 18GX1.5 BLUNT FILL (NEEDLE) IMPLANT
NEEDLE HYPO 25X1 1.5 SAFETY (NEEDLE) ×6 IMPLANT
NEEDLE SPNL 18GX3.5 QUINCKE PK (NEEDLE) ×3 IMPLANT
NS IRRIG 1000ML POUR BTL (IV SOLUTION) ×3 IMPLANT
OIL CARTRIDGE MAESTRO DRILL (MISCELLANEOUS) ×6
PACK LAMINECTOMY NEURO (CUSTOM PROCEDURE TRAY) ×6 IMPLANT
PAD ARMBOARD 7.5X6 YLW CONV (MISCELLANEOUS) ×9 IMPLANT
PIN HEAD 2.5X60MM (PIN) IMPLANT
PROBE PDCL 0.5MM ACCSS LONG (INSTRUMENTS) ×2 IMPLANT
PROBE SURGICAL 23CM BALL TIP (INSTRUMENTS) ×2 IMPLANT
PUTTY DBM GRAFTON 5CC (Putty) ×2 IMPLANT
ROD 5.5X45MM SOLERA VOYAGER (Rod) ×6 IMPLANT
SCREW 5.5X40MM SOLERA VOYAGER (Screw) ×8 IMPLANT
SCREW SCHANZ SA 4.0MM (MISCELLANEOUS) IMPLANT
SCREW SET 5.5/6.0MM SOLERA (Screw) ×12 IMPLANT
SLEEVE SURGEON STRL (DRAPES) ×3 IMPLANT
SPACER OLIF25 20MM 12 DEG 12X5 (Spacer) ×2 IMPLANT
SPONGE LAP 4X18 X RAY DECT (DISPOSABLE) IMPLANT
SPONGE SURGIFOAM ABS GEL 100 (HEMOSTASIS) ×3 IMPLANT
SPONGE SURGIFOAM ABS GEL SZ50 (HEMOSTASIS) ×3 IMPLANT
SPONGE TONSIL 1 RF SGL (DISPOSABLE) ×9 IMPLANT
STAPLER SKIN PROX WIDE 3.9 (STAPLE) ×3 IMPLANT
STRIP CLOSURE SKIN 1/2X4 (GAUZE/BANDAGES/DRESSINGS) IMPLANT
SUT VIC AB 0 CT1 18XCR BRD8 (SUTURE) ×3 IMPLANT
SUT VIC AB 0 CT1 8-18 (SUTURE) ×6
SUT VIC AB 1 CT1 18XBRD ANBCTR (SUTURE) ×1 IMPLANT
SUT VIC AB 1 CT1 8-18 (SUTURE) ×2
SUT VIC AB 2-0 CT1 18 (SUTURE) ×3 IMPLANT
SUT VIC AB 3-0 SH 8-18 (SUTURE) ×3 IMPLANT
SUT VICRYL 3-0 RB1 18 ABS (SUTURE) ×9 IMPLANT
SYR 3ML LL SCALE MARK (SYRINGE) IMPLANT
TOWEL GREEN STERILE (TOWEL DISPOSABLE) ×4 IMPLANT
TOWEL GREEN STERILE FF (TOWEL DISPOSABLE) ×4 IMPLANT
TRAP SPECIMEN MUCOUS 40CC (MISCELLANEOUS) ×3 IMPLANT
TRAY FOLEY W/METER SILVER 16FR (SET/KITS/TRAYS/PACK) ×3 IMPLANT
WATER STERILE IRR 1000ML POUR (IV SOLUTION) ×6 IMPLANT

## 2017-03-15 NOTE — Anesthesia Postprocedure Evaluation (Signed)
Anesthesia Post Note  Patient: Kathleen Hansen  Procedure(s) Performed: Procedure(s) (LRB): ANTERIOR LATERAL LUMBAR INTERBODY FUSION LUMBAR FOUR- LUMBAR FIVE (N/A) ROBOTIC PERCUTANEOUS PEDICLE SCREW LUMBAR FOUR- LUMBAR FIVE (N/A) APPLICATION OF ROBOTIC ASSISTANCE FOR SPINAL PROCEDURE (N/A)  Patient location during evaluation: PACU Anesthesia Type: General Level of consciousness: awake Pain management: pain level controlled Vital Signs Assessment: post-procedure vital signs reviewed and stable Respiratory status: spontaneous breathing Cardiovascular status: stable Anesthetic complications: no       Last Vitals:  Vitals:   03/15/17 1525 03/15/17 1540  BP: 108/67 115/74  Pulse: 95 95  Resp: 20 20  Temp:  36.6 C    Last Pain:  Vitals:   03/15/17 1548  TempSrc:   PainSc: 2                  Kathleen Hansen     

## 2017-03-15 NOTE — Op Note (Signed)
PREOP DIAGNOSIS:  1. Spondylolisthesis, L4-5 2. Lumbago with radiculopathy, L4-5   POSTOP DIAGNOSIS: Same  PROCEDURE STAGE 1: 1. Anterior interbody arthrodesis, L4-5 2. Placement of anterior interbody device, Medtronic 12degree  OLIF PEEK spacer 3. Use of morcellized bone allograft, Medtronic Grafton, BMP 4. Intraoperative electrophysiologic monitoring  PROCEDURE STAGE 2: 1. Posterolateral arthrodesis, left L4-5 2. Non-segmental pedicle screw instrumentation, Medtronic Solera 5.5 x 40mm x4 3. Use of navigated robotic assistance 4. Use of morcellized allograft, Grafton, BMP  SURGEON: Dr. Lisbeth Renshaw, MD  ASSISTANT: Dr. Cherrie Distance, MD  ANESTHESIA: General Endotracheal  EBL: 100cc  SPECIMENS: None  DRAINS: None  COMPLICATIONS: None immediate  CONDITION: Stable to PACU  HISTORY: Kathleen Hansen is a 46 y.o. female managed in the office with progressively worsening back and leg pain. MRI and Xray demonstrated mobile spondylolisthesis at L4-5. She had progression of pain despite multiple conservative treatments and elected to proceed with surgical stabilization/fusion. The risks and benefits were reviewed with the patient and her family. After all questions were answered, consent was obtained.  PROCEDURE IN DETAIL: The patient was brought to the operating room. After induction of general anesthesia, the patient was positioned on the operative table in the right lateral decubitus position. All pressure points were meticulously padded. Electrodes were placed for use of intraoperative elective physiologic monitoring including free running EMG. Fluoroscopy was then used to mark out the surface projection of the L4-5 disc space. Skin incision was then marked out anterior and slightly medial to the iliac crest, approximately 8 cm anterior to the projection of the disc space. The area was then prepped and draped in the usual sterile fashion.  After timeout was conducted, the  previous marked incision was infiltrated with local anesthetic with epinephrine. Incision was then made sharply and Bovie electrocautery was used to carry the incision down through subcutaneous tissue until the oblique musculature was identified. This was then bluntly dissected until the transversalis fascia was identified and the retro-peritoneal space was entered. Dissection was then carried down bluntly until the psoas muscle was identified, and a hand-held retractor was placed at the anterior margin, just lateral to the major vessels. Intraoperative fluoroscopy was used to confirm good position of the retractor. The table mounted retractor was then placed, and a retention screw was placed in the L4 vertebral body. Retractor was then opened to identify the L4-5 disc space including the endplates at L4 and L5. No EMG activity was noted.  At this point, the annulus was incised, and using a combination of shavers, curettes, and elevators, as well as backs cutters, discectomy at L4-5 was completed, including release of the contralateral annulus. The above graft was then trialed, and fit was confirmed with intraoperative AP and lateral fluoroscopy. The graft was then filled with morcellized allograft, and BMP. Again under AP and lateral fluoroscopy, the graft was then tapped into place, with final position confirmed with fluoroscopy.  Retractor was then removed under direct vision, and hemostasis was achieved. Wound was then closed in standard fashion in multiple layers using 1 Vicryl and 3-0 Vicryl subcuticular. Dermabond was then applied with a sterile dressing after it dried.  At this point, the patient was then transferred onto the [facility] table in the prone position, again with all pressure points meticulously padded. The skin of the lower back was then prepped and draped in the usual sterile fashion. After second timeout was conducted, the robot was attached to the table, and affixed to the patient using a  posterior  superior iliac spine fixation device. AP and oblique fluoroscopy was then used to register with the preoperative stereotactic CT scan. Pedicle trajectories for L4 and L5 were previously planned out, and the robot was used to mark the surface projection on the skin of these pedicle screws. This was then used to plan bilateral skin incisions. These were then infiltrated with local anesthetic with epinephrine.   Initially on the left, incision was made sharply and Bovie electrocautery was used to dissect through subcutaneous tissue until the lumbodorsal fascia was identified and incised. Under AP fluoroscopy, tubular dilator followed by tubular retractor was placed on the L4-5 facet complex on the left. This was then affixed to the table mounted. The facet complex and lateral portion of the L4 lamina were then corticated with the high-speed drill in preparation for arthrodesis. Morcellized bone allograft mixed with BMP was then placed over the decorticated surfaces. Tubular retractor was then removed.  At this point the robot was used to place K wires in the pedicles of L4 and L5 bilaterally. Position was confirmed with AP and lateral fluoroscopy. The pedicles were then tapped to 5.5 x 40 mm, and same size screws were placed in all 4 pedicles. Position was again confirmed with fluoroscopy. 45 mm pre-bent lordotic rod was then placed, set screws were placed, and final tightened. Final AP and lateral fluoroscopy was taken confirming good hardware position.  At this point the wounds were closed in multiple layers in standard fashion using interrupted 0 and 3-0 Vicryl stitches. Dermabond was applied and allowed to dry. Sterile dressing was then applied. At the end of the case all sponge, needle, instrument, and cottonoid counts were correct. The patient was then transferred to the stretcher, extubated, and taken to the post anesthesia care unit in stable hemodynamic condition.

## 2017-03-15 NOTE — Transfer of Care (Signed)
Immediate Anesthesia Transfer of Care Note  Patient: Kathleen Hansen  Procedure(s) Performed: Procedure(s): ANTERIOR LATERAL LUMBAR INTERBODY FUSION LUMBAR FOUR- LUMBAR FIVE (N/A) ROBOTIC PERCUTANEOUS PEDICLE SCREW LUMBAR FOUR- LUMBAR FIVE (N/A) APPLICATION OF ROBOTIC ASSISTANCE FOR SPINAL PROCEDURE (N/A)  Patient Location: PACU  Anesthesia Type:General  Level of Consciousness: awake, alert , oriented and patient cooperative  Airway & Oxygen Therapy: Patient Spontanous Breathing and Patient connected to face mask oxygen  Post-op Assessment: Report given to RN, Post -op Vital signs reviewed and stable and Patient moving all extremities X 4  Post vital signs: Reviewed and stable  Last Vitals:  Vitals:   03/15/17 0648  BP: 128/76  Pulse: 83  Resp: 20  Temp: 36.7 C    Last Pain:  Vitals:   03/15/17 0648  TempSrc: Oral         Complications: No apparent anesthesia complications

## 2017-03-15 NOTE — Anesthesia Procedure Notes (Addendum)
Procedure Name: Intubation Date/Time: 03/15/2017 8:32 AM Performed by: Everlean Cherry A Pre-anesthesia Checklist: Patient identified, Emergency Drugs available, Suction available, Patient being monitored and Timeout performed Patient Re-evaluated:Patient Re-evaluated prior to inductionOxygen Delivery Method: Circle system utilized Preoxygenation: Pre-oxygenation with 100% oxygen Intubation Type: IV induction and Cricoid Pressure applied Ventilation: Mask ventilation without difficulty Laryngoscope Size: Mac and 4 Grade View: Grade II Tube type: Oral Number of attempts: 1 Airway Equipment and Method: Patient positioned with wedge pillow and Stylet

## 2017-03-15 NOTE — Anesthesia Preprocedure Evaluation (Signed)
Anesthesia Evaluation  Patient identified by MRN, date of birth, ID band Patient awake    Reviewed: Allergy & Precautions, NPO status , Patient's Chart, lab work & pertinent test results  Airway Mallampati: II  TM Distance: >3 FB     Dental   Pulmonary Current Smoker,    breath sounds clear to auscultation       Cardiovascular negative cardio ROS   Rhythm:Regular Rate:Normal     Neuro/Psych    GI/Hepatic negative GI ROS, Neg liver ROS,   Endo/Other  negative endocrine ROS  Renal/GU negative Renal ROS     Musculoskeletal  (+) Arthritis ,   Abdominal   Peds  Hematology   Anesthesia Other Findings   Reproductive/Obstetrics                             Anesthesia Physical Anesthesia Plan  ASA: II  Anesthesia Plan: General   Post-op Pain Management:    Induction: Intravenous  Airway Management Planned: Oral ETT  Additional Equipment:   Intra-op Plan:   Post-operative Plan: Possible Post-op intubation/ventilation  Informed Consent: I have reviewed the patients History and Physical, chart, labs and discussed the procedure including the risks, benefits and alternatives for the proposed anesthesia with the patient or authorized representative who has indicated his/her understanding and acceptance.   Dental advisory given  Plan Discussed with: CRNA and Anesthesiologist  Anesthesia Plan Comments:         Anesthesia Quick Evaluation

## 2017-03-15 NOTE — H&P (Signed)
CC:  Back Pain  HPI: Kathleen Hansen presents for back pain. She reports over the last 2 months she has had a "significant worsening "in her back pain. She reports the pain is radiating down her right leg to her toes. Her back and leg feels numb. She endorses right leg weakness. She states she is no longer able to toe in she needs to empty her bladder have a bowel movement. She endorses rectal numbness. She went to an urgent care several weeks ago was given steroids. She reports that this helped with her back pain but she continues to have excruciating pain down the leg.   PMH: Past Medical History:  Diagnosis Date  . Anxiety   . Arthritis    Back  . Back pain   . Depression     PSH: Past Surgical History:  Procedure Laterality Date  . APPENDECTOMY    . CHOLECYSTECTOMY    . COLONOSCOPY    . DILATION AND CURETTAGE OF UTERUS     pre-cancerous cells removed from uterus  . TUBAL LIGATION    . WISDOM TOOTH EXTRACTION      SH: Social History  Substance Use Topics  . Smoking status: Current Every Day Smoker    Packs/day: 1.50  . Smokeless tobacco: Never Used  . Alcohol use Yes     Comment: occasionally    MEDS: Prior to Admission medications   Medication Sig Start Date End Date Taking? Authorizing Provider  Aspirin-Salicylamide-Caffeine (BC FAST PAIN RELIEF) [phone] MG PACK Take 1 Package by mouth every 8 (eight) hours as needed (for pain.).   Yes Historical Provider, MD  carisoprodol (SOMA) 350 MG tablet Take 1 tablet (350 mg total) by mouth 3 (three) times daily. Patient taking differently: Take 350 mg by mouth 3 (three) times daily as needed for muscle spasms.  02/17/17  Yes [name], MD  Diclofenac 18 MG CAPS Take 1 capsule by mouth 2 (two) times daily as needed. Patient taking differently: Take 1 capsule by mouth 2 (two) times daily as needed (for pain.). ZORVOLEX 02/17/17  Yes [name], MD  DULoxetine (CYMBALTA) 30 MG capsule Take 30 mg by mouth daily. 02/14/17   Yes Historical Provider, MD  DULoxetine (CYMBALTA) 60 MG capsule Take 60 mg by mouth daily. 01/05/17  Yes Historical Provider, MD  gabapentin (NEURONTIN) 600 MG tablet Take 600 mg by mouth 4 (four) times daily. 02/23/17  Yes Historical Provider, MD  hydroxypropyl methylcellulose / hypromellose (ISOPTO TEARS / GONIOVISC) 2.5 % ophthalmic solution Place 1 drop into both eyes 4 (four) times daily as needed for dry eyes.   Yes Historical Provider, MD  sertraline (ZOLOFT) 100 MG tablet Take 100 mg by mouth daily. 12/10/16  Yes Historical Provider, MD  meloxicam (MOBIC) 15 MG tablet Take 15 mg by mouth daily.    Historical Provider, MD    ALLERGY: Allergies  Allergen Reactions  . No Known Allergies     ROS: ROS  NEUROLOGIC EXAM: Awake, alert, oriented Memory and concentration grossly intact Speech fluent, appropriate CN grossly intact Motor exam: Upper Extremities Deltoid Bicep Tricep Grip  Right 5/5 5/5 5/5 5/5  Left 5/5 5/5 5/5 5/5   Lower Extremity IP Quad PF DF EHL  Right 4/5 4/5 4/5 4/5 4/5  Left 5/5 5/5 5/5 5/5 5/5   Sensation grossly intact to LT  [facility]: Previous x-rays and MRI were again reviewed. These again demonstrate mobile spondylolisthesis at L4-5 with some facet arthropathy, but minimal fixed central or foraminal  stenosis.  IMPRESSION: - 46 y.o. female with back and leg pain related to mobile spondy at L4-5, unresponsive to conservative care  PLAN: - L4-5 lateral interbody with posterior instrumented fusion  I have reviewed the risks, benefits, and alternatives to surgery in the office. All questions were answered.

## 2017-03-15 NOTE — Anesthesia Postprocedure Evaluation (Signed)
Anesthesia Post Note  Patient: Glena Pharris  Procedure(s) Performed: Procedure(s) (LRB): ANTERIOR LATERAL LUMBAR INTERBODY FUSION LUMBAR FOUR- LUMBAR FIVE (N/A) ROBOTIC PERCUTANEOUS PEDICLE SCREW LUMBAR FOUR- LUMBAR FIVE (N/A) APPLICATION OF ROBOTIC ASSISTANCE FOR SPINAL PROCEDURE (N/A)  Patient location during evaluation: PACU Anesthesia Type: General Level of consciousness: awake Pain management: pain level controlled Vital Signs Assessment: post-procedure vital signs reviewed and stable Respiratory status: spontaneous breathing Cardiovascular status: stable Anesthetic complications: no       Last Vitals:  Vitals:   03/15/17 1525 03/15/17 1540  BP: 108/67 115/74  Pulse: 95 95  Resp: 20 20  Temp:  36.6 C    Last Pain:  Vitals:   03/15/17 1548  TempSrc:   PainSc: 2                  Tarri Guilfoil

## 2017-03-16 MED ORDER — OXYCODONE HCL 5 MG PO TABS: 5.0000 mg | ORAL_TABLET | ORAL | 0 refills | Status: DC | PRN

## 2017-03-16 NOTE — Progress Notes (Signed)
Patient alert and oriented, mae's well, voiding adequate amount of urine, swallowing without difficulty, no c/o pain. Patient discharged home with family. Script and discharged instructions given to patient. Patient and family stated understanding of d/c instructions given and has an appointment with Dr. Patric Dykes

## 2017-03-16 NOTE — Evaluation (Signed)
Occupational Therapy Evaluation Patient Details Name: Kathleen Hansen MRN: [id number] DOB: 10-Jun-1971 Today's Date: 03/16/2017    History of Present Illness Pt is a 46 y/o female who presents s/p L4-L5 ALIF on 03/15/17.   Clinical Impression   Patient evaluated by Occupational Therapy with no further acute OT needs identified. All education has been completed and the patient has no further questions. See below for any follow-up Occupational Therapy or equipment needs. OT to sign off. Thank you for referral.      Follow Up Recommendations  No OT follow up    Equipment Recommendations  3 in 1 bedside commode    Recommendations for Other Services       Precautions / Restrictions Precautions Precautions: Fall;Back Precaution Booklet Issued: Yes (comment) (OT provided) Precaution Comments: educated on adls with back precautions handout Required Braces or Orthoses: Spinal Brace Spinal Brace: Lumbar corset;Applied in sitting position Restrictions Weight Bearing Restrictions: No      Mobility Bed Mobility Overal bed mobility: Modified Independent             General bed mobility comments: cues to avoid twisiting to push bedside table out of the way with L UE  Transfers Overall transfer level: Modified independent Equipment used: None Transfers: Sit to/from Stand           General transfer comment: No assist required. No unsteadiness noted.     Balance Overall balance assessment: No apparent balance deficits (not formally assessed)                                         ADL either performed or assessed with clinical judgement   ADL Overall ADL's : Needs assistance/impaired Eating/Feeding: Independent   Grooming: Independent   Upper Body Bathing: Independent   Lower Body Bathing: Min guard;Sit to/from stand Lower Body Bathing Details (indicate cue type and reason): pt is able to cross bil Le and has don pants this am by herself   Upper  Body Dressing Details (indicate cue type and reason): able to don brace mod i     Toilet Transfer: Supervision/safety           Functional mobility during ADLs: Supervision/safety General ADL Comments: pt requesting a 3n1 for d/c home due to low seat at home  Back handout provided and reviewed adls in detail. Pt educated on: clothing between brace, never sleep in brace, set an alarm at night for medication, avoid sitting for long periods of time, correct bed positioning for sleeping, correct sequence for bed mobility, avoiding lifting more than 5 pounds and never wash directly over incision. All education is complete and patient indicates understanding.  Pt educated on bathing and avoid washing directly on incision. Pt educated to use new wash cloth and towel each day. Pt educated to allow water to run across dressing and not to soak in a tub at this time. Pt advised RN will instruct on any bandages required otherwise is open to air.      Vision Baseline Vision/History: Wears glasses Wears Glasses: At all times       Perception     Praxis      Pertinent Vitals/Pain Pain Assessment: Faces Faces Pain Scale: Hurts little more Pain Location: Incision site Pain Descriptors / Indicators: Operative site guarding;Discomfort Pain Intervention(s): Monitored during session;Premedicated before session;Repositioned     Hand Dominance Right   Extremity/Trunk Assessment Upper  Extremity Assessment Upper Extremity Assessment: Overall WFL for tasks assessed   Lower Extremity Assessment Lower Extremity Assessment: Defer to PT evaluation RLE Deficits / Details: Pt reports pain and weakness in the RLE consistent with pre-op diagnosis.  LLE Deficits / Details: Pt reports general feeling of weakness in the LLE.    Cervical / Trunk Assessment Cervical / Trunk Assessment: Other exceptions Cervical / Trunk Exceptions: s/p lumbar surgery   Communication Communication Communication: No  difficulties   Cognition Arousal/Alertness: Awake/alert Behavior During Therapy: WFL for tasks assessed/performed Overall Cognitive Status: Within Functional Limits for tasks assessed                                     General Comments       Exercises     Shoulder Instructions      Home Living Family/patient expects to be discharged to:: Private residence Living Arrangements: Spouse/significant other Available Help at Discharge: Family;Available 24 hours/day Type of Home: House Home Access: Stairs to enter [employer] of Steps: 6 Entrance Stairs-Rails: Left Home Layout: One level     Bathroom Shower/Tub: Chief Strategy Officer: Standard     Home Equipment: Environmental consultant - 4 wheels   Additional Comments: pt works in [employer] and has rollator from therapy staff. pt will have 2 daughters and spouse (A) upon d/c for 24/7 care as needed      Prior Functioning/Environment Level of Independence: Independent                 OT Problem List:        OT Treatment/Interventions:      OT Goals(Current goals can be found in the care plan section) Acute Rehab OT Goals Patient Stated Goal: Home today  OT Frequency:     Barriers to D/C:            Co-evaluation              End of Session Equipment Utilized During Treatment: Back brace Nurse Communication: Mobility status;Precautions  Activity Tolerance: Patient tolerated treatment well Patient left: in bed;with call bell/phone within reach  OT Visit Diagnosis: Unsteadiness on feet (R26.81)                Time: 1610-9604 OT Time Calculation (min): 33 min Charges:  OT General Charges $OT Visit: 1 Procedure OT Evaluation $OT Eval Moderate Complexity: 1 Procedure OT Treatments $Self Care/Home Management : 8-22 mins G-Codes:      Mateo Flow   OTR/L Pager: [phone] Office: [phone] .   Boone Master B 03/16/2017, 12:21 PM

## 2017-03-16 NOTE — Evaluation (Addendum)
Physical Therapy Evaluation and Discharge Patient Details Name: Kathleen Hansen MRN: [id number] DOB: 1971/05/31 Today's Date: 03/16/2017   History of Present Illness  Pt is a 46 y/o female who presents s/p L4-L5 ALIF on 03/15/17.  Clinical Impression  Patient evaluated by Physical Therapy with no further acute PT needs identified. All education has been completed and the patient has no further questions. At the time of PT eval pt was able to perform transfers and ambulation with gross modified independence. She negotiated stairs with light supervision for safety. Family present during session for education. Pt was educated on walking program, precautions, car transfer, and general safety at home. See below for any follow-up Physical Therapy or equipment needs. PT is signing off. Thank you for this referral.     Follow Up Recommendations Outpatient PT;Supervision for mobility/OOB (When appropriate per post-op protocol)    Equipment Recommendations  None recommended by PT    Recommendations for Other Services       Precautions / Restrictions Precautions Precautions: Fall;Back Precaution Booklet Issued: Yes (comment) (OT provided) Precaution Comments: Pt was cued for precautions during functional mobility.  Required Braces or Orthoses: Spinal Brace Spinal Brace: Lumbar corset;Applied in sitting position Restrictions Weight Bearing Restrictions: No      Mobility  Bed Mobility               General bed mobility comments: Pt sitting up in recliner upon PT arrival.   Transfers Overall transfer level: Modified independent Equipment used: None Transfers: Sit to/from Stand           General transfer comment: No assist required. No unsteadiness noted.   Ambulation/Gait Ambulation/Gait assistance: Modified independent (Device/Increase time) Ambulation Distance (Feet): 250 Feet Assistive device: None Gait Pattern/deviations: Step-through pattern;Decreased stride length;Wide  base of support;Trendelenburg Gait velocity: Decreased Gait velocity interpretation: Below normal speed for age/gender General Gait Details: Noted significant trendelenberg gait pattern. Pt was cued for more natural arm swing and pt was able to make corrective changes. Pt did not report any fatigue after gait training.   Stairs Stairs: Yes Stairs assistance: Supervision Stair Management: One rail Left;Step to pattern;Forwards Number of Stairs: 6 General stair comments: VC's for sequencing. Pt did not require assist, and did not demonstrate any unsteadiness. Family present for education.   Wheelchair Mobility    Modified Rankin (Stroke Patients Only)       Balance Overall balance assessment: No apparent balance deficits (not formally assessed)                                           Pertinent Vitals/Pain Pain Assessment: Faces Faces Pain Scale: Hurts little more Pain Location: Incision site Pain Descriptors / Indicators: Operative site guarding;Discomfort Pain Intervention(s): Limited activity within patient's tolerance;Monitored during session;Repositioned    Home Living Family/patient expects to be discharged to:: Private residence Living Arrangements: Spouse/significant other Available Help at Discharge: Family;Available 24 hours/day Type of Home: House Home Access: Stairs to enter   [employer] of Steps: 6  Entrance Railing - Left Home Equipment - 4 wheeled walker          Prior Function Level of Independence: Independent               Hand Dominance        Extremity/Trunk Assessment   Upper Extremity Assessment Upper Extremity Assessment: Defer to OT evaluation    Lower  Extremity Assessment Lower Extremity Assessment: RLE deficits/detail;LLE deficits/detail RLE Deficits / Details: Pt reports pain and weakness in the RLE consistent with pre-op diagnosis.  LLE Deficits / Details: Pt reports general feeling of weakness  in the LLE.     Cervical / Trunk Assessment Cervical / Trunk Assessment: Other exceptions Cervical / Trunk Exceptions: s/p lumbar surgery  Communication   Communication: No difficulties  Cognition Arousal/Alertness: Awake/alert Behavior During Therapy: WFL for tasks assessed/performed Overall Cognitive Status: Within Functional Limits for tasks assessed                                        General Comments      Exercises     Assessment/Plan    PT Assessment Patent does not need any further PT services  PT Problem List         PT Treatment Interventions      PT Goals (Current goals can be found in the Care Plan section)  Acute Rehab PT Goals Patient Stated Goal: Home today PT Goal Formulation: All assessment and education complete, DC therapy    Frequency     Barriers to discharge        Co-evaluation               End of Session Equipment Utilized During Treatment: Gait belt;Back brace Activity Tolerance: Patient tolerated treatment well Patient left: in chair;with call bell/phone within reach;with family/visitor present Nurse Communication: Mobility status PT Visit Diagnosis: Other abnormalities of gait and mobility (R26.89);Pain Pain - part of body:  (Back)    Time: 1610-9604 PT Time Calculation (min) (ACUTE ONLY): 11 min   Charges:   PT Evaluation $PT Eval Moderate Complexity: 1 Procedure     PT G Codes:        Conni Slipper, PT, DPT Acute Rehabilitation Services Pager: [phone]   Marylynn Pearson 03/16/2017, 11:26 AM

## 2017-03-16 NOTE — Discharge Summary (Signed)
Physician Discharge Summary  Patient ID: Kathleen Hansen MRN: [id number] DOB/AGE: 1971/03/07 46 y.o.  Admit date: 03/15/2017 Discharge date: 03/16/2017  Admission Diagnoses:  Spondylolisthesis, L4-5  Discharge Diagnoses:  Same Active Problems:   Spondylolisthesis at L4-L5 level   Discharged Condition: Stable  Hospital Course:  Kathleen Hansen is a 46 y.o. female electively admitted after A/P L4-5 fusion. She was at neurologic baseline postop, reporting significant reduction in preoperative back and leg pain. She was ambulating independently, voiding normally, tolerating diet.  Treatments: Surgery - OLIF L4-5 with perc screws  Discharge Exam: Blood pressure 128/64, pulse 76, temperature 97.7 F (36.5 C), temperature source Oral, resp. rate 18, height  (1.575 m), weight 108 kg (238 lb), last menstrual period 03/05/2017, SpO2 100 %. Awake, alert, oriented Speech fluent, appropriate CN grossly intact 5/5 BUE/BLE Wound c/d/i  Disposition: 01-Home or Self Care  Discharge Instructions    Call MD for:  redness, tenderness, or signs of infection (pain, swelling, redness, odor or green/yellow discharge around incision site)    Complete by:  As directed    Call MD for:  temperature >100.4    Complete by:  As directed    Diet - low sodium heart healthy    Complete by:  As directed    Discharge instructions    Complete by:  As directed    Walk at home as much as possible, at least 4 times / day   Increase activity slowly    Complete by:  As directed    Lifting restrictions    Complete by:  As directed    No lifting > 10 lbs   May shower / Bathe    Complete by:  As directed    48 hours after surgery   May walk up steps    Complete by:  As directed    No dressing needed    Complete by:  As directed    Other Restrictions    Complete by:  As directed    No bending/twisting at waist     Allergies as of 03/16/2017      Reactions   No Known Allergies       Medication  List    STOP taking these medications   BC FAST PAIN RELIEF 650-195-33.3 MG Pack Generic drug:  Aspirin-Salicylamide-Caffeine   meloxicam 15 MG tablet Commonly known as:  MOBIC     TAKE these medications   carisoprodol 350 MG tablet Commonly known as:  SOMA Take 1 tablet (350 mg total) by mouth 3 (three) times daily. What changed:  when to take this  reasons to take this   Diclofenac 18 MG Caps Take 1 capsule by mouth 2 (two) times daily as needed. What changed:  reasons to take this  additional instructions   DULoxetine 60 MG capsule Commonly known as:  CYMBALTA Take 60 mg by mouth daily.   DULoxetine 30 MG capsule Commonly known as:  CYMBALTA Take 30 mg by mouth daily.   gabapentin 600 MG tablet Commonly known as:  NEURONTIN Take 600 mg by mouth 4 (four) times daily.   hydroxypropyl methylcellulose / hypromellose 2.5 % ophthalmic solution Commonly known as:  ISOPTO TEARS / GONIOVISC Place 1 drop into both eyes 4 (four) times daily as needed for dry eyes.   oxyCODONE 5 MG immediate release tablet Commonly known as:  Oxy IR/ROXICODONE Take 1-2 tablets (5-10 mg total) by mouth every 4 (four) hours as needed for breakthrough pain.   sertraline 100 MG  tablet Commonly known as:  ZOLOFT Take 100 mg by mouth daily.      Follow-up Information    Estefana Taylor, C, MD Follow up in 3 week(s).   Specialty:  Neurosurgery Contact information: [address] [place] [address] [phone]           Signed: Jackelyn Hoehn 03/16/2017, 8:38 AM

## 2017-03-21 ENCOUNTER — Encounter (HOSPITAL_COMMUNITY): Payer: Self-pay | Admitting: Neurosurgery

## 2017-03-22 ENCOUNTER — Encounter (HOSPITAL_COMMUNITY): Payer: Self-pay | Admitting: Neurosurgery

## 2017-07-28 ENCOUNTER — Encounter (HOSPITAL_COMMUNITY): Payer: Self-pay | Admitting: *Deleted

## 2017-07-28 ENCOUNTER — Emergency Department (HOSPITAL_COMMUNITY)
Admission: EM | Admit: 2017-07-28 | Discharge: 2017-07-29 | Disposition: A | Discharge status: Home / Self Care | Payer: BLUE CROSS/BLUE SHIELD | Attending: Emergency Medicine | Admitting: Emergency Medicine

## 2017-07-28 DIAGNOSIS — F17200 Nicotine dependence, unspecified, uncomplicated: Secondary | ICD-10-CM | POA: Diagnosis not present

## 2017-07-28 DIAGNOSIS — Z8739 Personal history of other diseases of the musculoskeletal system and connective tissue: Secondary | ICD-10-CM | POA: Diagnosis not present

## 2017-07-28 DIAGNOSIS — M79604 Pain in right leg: Secondary | ICD-10-CM | POA: Diagnosis not present

## 2017-07-28 DIAGNOSIS — Z79899 Other long term (current) drug therapy: Secondary | ICD-10-CM | POA: Diagnosis not present

## 2017-07-28 MED ORDER — MORPHINE SULFATE (PF) 4 MG/ML IV SOLN
4.0000 mg | Freq: Once | INTRAVENOUS | Status: AC
  Administered 2017-07-29: 4 mg via INTRAVENOUS
  Filled 2017-07-28: qty 1

## 2017-07-28 MED ORDER — ONDANSETRON HCL 4 MG/2ML IJ SOLN
4.0000 mg | Freq: Once | INTRAMUSCULAR | Status: AC
  Administered 2017-07-29: 4 mg via INTRAVENOUS
  Filled 2017-07-28: qty 2

## 2017-07-28 NOTE — ED Provider Notes (Signed)
[facility]-EMERGENCY DEPT Provider Note   CSN: [id number] Arrival date & time: 07/28/17  1825     History   Chief Complaint Chief Complaint  Patient presents with  . Leg Pain    HPI Kathleen Hansen is a 46 y.o. female.  Patient presents to the ED with a chief complaint of right leg pain and weakness.  She states that she has a history of the same and had an L4-5 fusion in the spring of this year by Dr. Conchita ParisNundkumar.  She states that she had been doing well, until the past few weeks.  She reports that today she noticed that her foot was dragging in the same manner that it was prior to her surgery in the spring.  She states that this and the increased pain brought her to the ER.  She denies any fevers, chills, changes in bowel or bladder.  She denies any other associated symptoms.   The history is provided by the patient. No language interpreter was used.    Past Medical History:  Diagnosis Date  . Anxiety   . Arthritis    Back  . Back pain   . Depression     Patient Active Problem List   Diagnosis Date Noted  . Spondylolisthesis at L4-L5 level 03/15/2017    Past Surgical History:  Procedure Laterality Date  . ANTERIOR LAT LUMBAR FUSION N/A 03/15/2017   Procedure: ANTERIOR LATERAL LUMBAR INTERBODY FUSION LUMBAR FOUR- LUMBAR FIVE;  Surgeon: Lisbeth RenshawNeelesh Nundkumar, MD;  Location: [facility] OR;  Service: Neurosurgery;  Laterality: N/A;  . APPENDECTOMY    . APPLICATION OF ROBOTIC ASSISTANCE FOR SPINAL PROCEDURE N/A 03/15/2017   Procedure: APPLICATION OF ROBOTIC ASSISTANCE FOR SPINAL PROCEDURE;  Surgeon: Lisbeth RenshawNeelesh Nundkumar, MD;  Location: [facility] OR;  Service: Neurosurgery;  Laterality: N/A;  . CHOLECYSTECTOMY    . COLONOSCOPY    . DILATION AND CURETTAGE OF UTERUS     pre-cancerous cells removed from uterus  . LUMBAR PERCUTANEOUS PEDICLE SCREW 1 LEVEL N/A 03/15/2017   Procedure: ROBOTIC PERCUTANEOUS PEDICLE SCREW LUMBAR FOUR- LUMBAR FIVE;  Surgeon: Lisbeth RenshawNeelesh Nundkumar, MD;  Location: [facility] OR;  Service:  Neurosurgery;  Laterality: N/A;  . TUBAL LIGATION    . WISDOM TOOTH EXTRACTION      OB History    No data available       Home Medications    Prior to Admission medications   Medication Sig Start Date End Date Taking? Authorizing Provider  acetaminophen (TYLENOL) 650 MG CR tablet Take 650 mg by mouth every 6 (six) hours as needed for pain.   Yes [provider]  Aspirin-Caffeine (BC FAST PAIN RELIEF ARTHRITIS PO) Take 1 packet by mouth See admin instructions. ONE TO SIX TIMES A DAY AS NEEDED FOR PAIN   Yes [provider]  DULoxetine (CYMBALTA) 30 MG capsule Take 90 mg by mouth every morning.  02/14/17  Yes [provider]  Gabapentin Enacarbil (HORIZANT) 600 MG TBCR Take 600 mg by mouth 2 (two) times daily.   Yes [provider]  Gluc-Chonn-MSM-Boswellia-Vit D (GLUCOSAMINE CHOND TRIPLE/VIT D) TABS Take 2 tablets by mouth every morning.   Yes [provider]  hydroxypropyl methylcellulose / hypromellose (ISOPTO TEARS / GONIOVISC) 2.5 % ophthalmic solution Place 1 drop into both eyes 4 (four) times daily as needed for dry eyes.   Yes [provider]  ibuprofen (ADVIL,MOTRIN) 200 MG tablet Take 800 mg by mouth every 6 (six) hours as needed (for pain).   Yes [provider]  naproxen sodium (ALEVE) 220 MG tablet Take 440 mg by mouth 2 (two) times daily as needed (for pain).   Yes [provider]  sertraline (ZOLOFT) 100 MG tablet Take 100 mg by mouth daily. 12/10/16  Yes [provider]  carisoprodol (SOMA) 350 MG tablet Take 1 tablet (350 mg total) by mouth 3 (three) times daily. Patient not taking: Reported on 07/28/2017 02/17/17   Mesner, Barbara Cower, MD  Diclofenac 18 MG CAPS Take 1 capsule by mouth 2 (two) times daily as needed. Patient not taking: Reported on 07/28/2017 02/17/17   Mesner, Barbara Cower, MD  oxyCODONE (OXY IR/ROXICODONE) 5 MG immediate release tablet Take 1-2 tablets (5-10 mg total) by mouth every 4 (four) hours  as needed for breakthrough pain. Patient not taking: Reported on 07/28/2017 03/16/17   Lisbeth Renshaw, MD    Family History No family history on file.  Social History Social History  Substance Use Topics  . Smoking status: Current Every Day Smoker    Packs/day: 1.50  . Smokeless tobacco: Never Used  . Alcohol use Yes     Comment: occasionally     Allergies   Patient has no known allergies.   Review of Systems Review of Systems  All other systems reviewed and are negative.    Physical Exam Updated Vital Signs BP 113/80 (BP Location: Left Arm)   Pulse 89   Temp 98 F (36.7 C) (Oral)   Resp 15   Ht  (1.575 m)   Wt 120.2 kg (265 lb)   SpO2 100%   BMI 48.47 kg/m   Physical Exam Physical Exam  Constitutional: Pt appears well-developed and well-nourished. No distress.  HENT:  Head: Normocephalic and atraumatic.  Mouth/Throat: Oropharynx is clear and moist. No oropharyngeal exudate.  Eyes: Conjunctivae are normal.  Neck: Normal range of motion. Neck supple.  No meningismus Cardiovascular: Normal rate, regular rhythm and intact distal pulses.   Pulmonary/Chest: Effort normal and breath sounds normal. No respiratory distress. Pt has no wheezes.  Abdominal: Pt exhibits no distension Musculoskeletal:  Lumbar spine tender to palpation, no bony CTLS spine tenderness, deformity, step-off, or crepitus Lymphadenopathy: Pt has no cervical adenopathy.  Neurological: Pt is alert and oriented Speech is clear and goal oriented, follows commands Reports diminished sensation in right lower extremity compared to left Strength of RLE 4/5, LLE 5/5 Great toe extension intact Moves extremities without ataxia, coordination intact Ankle and knee jerk reflexes intact and symmetrical  Normal balance No Clonus Skin: Skin is warm and dry. No rash noted. Pt is not diaphoretic. No erythema.  Psychiatric: Pt has a normal mood and affect. Behavior is normal.  Nursing note and vitals  reviewed.   ED Treatments / Results  Labs (all labs ordered are listed, but only abnormal results are displayed) Labs Reviewed  CBC WITH DIFFERENTIAL/PLATELET - Abnormal; Notable for the following:       Result Value   Lymphs Abs 4.2 (*)    All other components within normal limits  BASIC METABOLIC PANEL - Abnormal; Notable for the following:    CO2 19 (*)    Glucose, Bld 101 (*)    All other components within normal limits    EKG  EKG Interpretation None       Radiology Mr Lumbar Spine W Wo Contrast  Result Date: 07/29/2017 CLINICAL DATA:  46 y/o F; rapidly progressive neurologic deficit stranding of the right foot right lower pain. EXAM: MRI LUMBAR SPINE WITHOUT AND WITH CONTRAST TECHNIQUE: Multiplanar and multiecho  pulse sequences of the lumbar spine were obtained without and with intravenous contrast. CONTRAST:  15mL MULTIHANCE GADOBENATE DIMEGLUMINE 529 MG/ML IV SOLN COMPARISON:  03/04/2017 CT lumbar spine. 02/17/2017 MRI lumbar spine. FINDINGS: Segmentation:  Standard. Alignment: Grade 1 L4-5 anterolisthesis and grade 1 L5-S1 retrolisthesis the is stable. Stable minimal L2-3 retrolisthesis. Vertebrae: L4-5 posterior and interbody fusion. Susceptibility artifact from the fusion hardware partially obscures the vertebral bodies at those levels. No evidence for discitis, acute fracture, or suspicious bone lesion. Trace facet effusions bilaterally at the L2-3 and L3-4 levels. Conus medullaris: Extends to the T12-L1 level and appears normal. Paraspinal and other soft tissues: Negative. Disc levels: L1-2: No significant disc displacement, foraminal narrowing, or canal stenosis. L2-3: Small disc bulge and mild facet hypertrophy. Mild foraminal stenosis. No significant canal stenosis. L3-4: No significant disc displacement. Mild facet and ligamentum flavum hypertrophy. Mild canal and lateral recess stenosis. No significant foraminal stenosis. L4-5: Discectomy infusion. Interval small extrusion  of disc material anteriorly with superior migration the (series 3, image 7) resulting in moderate canal stenosis at the L4 vertebral body level the (series 5, image 19) persistent moderate stenosis at the L4-5 intervertebral disc level. Neural foramen are obscured by susceptibility artifact. L5-S1: Stable small disc bulge eccentric to the left the combined with facet hypertrophy and ligamentum flavum hypertrophy resulting in mild bilateral foraminal stenosis. Disc contact on the descending left S1 nerve root in the left lateral recess is again seen (series 2, image 8). IMPRESSION: 1. Interval small extrusion of L4-5 disc material with superior migration resulting in moderate canal stenosis at the L4 vertebral body level. Persistent moderate stenosis of the L4-5 intervertebral disc level. 2. Stable L5-S1 disc bulge eccentric to the left subarticular zone with contact on descending left S1 nerve root. 3. Stable grade 1 L4-5 anterolisthesis, grade 1 L5-S1 retrolisthesis, and minimal L2-3 retrolisthesis. 4. Postsurgical changes related L4-L5 posterior instrumented and interbody fusion. No discrete fluid collection is identified. 5. No acute osseous abnormality is evident. Electronically Signed   By: Mitzi Hansen M.D.   On: 07/29/2017 05:15    Procedures Procedures (including critical care time)  Medications Ordered in ED Medications - No data to display   Initial Impression / Assessment and Plan / ED Course  I have reviewed the triage vital signs and the nursing notes.  Pertinent labs & imaging results that were available during my care of the patient were reviewed by me and considered in my medical decision making (see chart for details).     Patient with right leg pain.  Hx of lumbar fusion.  Now reports new/worsening leg pain and weakness and dragging her foot today.  Will check MRI.    MRI as above.  Discussed with on-call PA from neurosurgery who will come to evaluate the  patient.  Dispo per neurosurgery.    6:33 AM Patient seen by Cindra Presume, PA-C from neurosurgery, who recommends discharge and will see the patient next week in follow-up.  Final Clinical Impressions(s) / ED Diagnoses   Final diagnoses:  Right leg pain    New Prescriptions New Prescriptions   No medications on file     Roxy Horseman, Cordelia Poche 07/29/17 1610    Melene Plan, DO 07/29/17 [phone]

## 2017-07-28 NOTE — ED Triage Notes (Signed)
Pt reports a spinal fusion in April pt states that over the last month she has had progressive leg pain. Pt states that it has worsened further and not she reports "dragging my leg". Pt states that she is unable to move her rt leg due to pain.  Pt reports swelling in her left leg as well. Pt states that she has left a message for her surgeon but has not heard back. Denies any recent strenuous activity

## 2017-07-28 NOTE — Medical Student Note (Addendum)
[facility]ENC[facility] DEPT Provider Student Note For educational purposes for Medical, PA and NP students only and not part of the legal medical record.   CSN: [id number] Arrival date & time: 07/28/17  1825     History   Chief Complaint Chief Complaint  Patient presents with  . Leg Pain    HPI Kathleen Hansen is a 46 y.o. female with PMH of spondylolisthesis at L4-L5 in April 2018, tobacco abuse, and long-standing bowel/bladder incontinence who presents today for evaluation of bilateral leg pain, R > L. She developed bilateral leg pain 1 month ago which is acutely worse this evening. She had difficulty walking due to 8/10 pain and had to ask for assistance getting out of her truck after work. She describes the right leg pain as both stabbing and heaviness, causing her to drag her right leg. She has noticed left knee and bilateral ankle swelling. Bowel/bladder incontinence is unchanged from the past. She has tried OTC tylenol, ibuprofen, and Advil with only minor pain relief. She denies any recent travel or long sedentary periods.  ROS Endorses bilateral leg pain, decreased ROM, bilateral ankle and left knee swelling, bowel and bladder incontinence.   Denies headache, dizziness, changes in vision, chest pain, palpitations, shortness of breath, abdominal pain, nausea, vomiting, extremity numbness/tingling.  HPI  Past Medical History:  Diagnosis Date  . Anxiety   . Arthritis    Back  . Back pain   . Depression     Patient Active Problem List   Diagnosis Date Noted  . Spondylolisthesis at L4-L5 level 03/15/2017    Past Surgical History:  Procedure Laterality Date  . ANTERIOR LAT LUMBAR FUSION N/A 03/15/2017   Procedure: ANTERIOR LATERAL LUMBAR INTERBODY FUSION LUMBAR FOUR- LUMBAR FIVE;  Surgeon: Lisbeth RenshawNeelesh Nundkumar, MD;  Location: [facility] OR;  Service: Neurosurgery;  Laterality: N/A;  . APPENDECTOMY    . APPLICATION OF ROBOTIC ASSISTANCE FOR SPINAL PROCEDURE N/A 03/15/2017   Procedure:  APPLICATION OF ROBOTIC ASSISTANCE FOR SPINAL PROCEDURE;  Surgeon: Lisbeth RenshawNeelesh Nundkumar, MD;  Location: [facility] OR;  Service: Neurosurgery;  Laterality: N/A;  . CHOLECYSTECTOMY    . COLONOSCOPY    . DILATION AND CURETTAGE OF UTERUS     pre-cancerous cells removed from uterus  . LUMBAR PERCUTANEOUS PEDICLE SCREW 1 LEVEL N/A 03/15/2017   Procedure: ROBOTIC PERCUTANEOUS PEDICLE SCREW LUMBAR FOUR- LUMBAR FIVE;  Surgeon: Lisbeth RenshawNeelesh Nundkumar, MD;  Location: [facility];  Service: Neurosurgery;  Laterality: N/A;  . TUBAL LIGATION    . WISDOM TOOTH EXTRACTION      OB History    No data available       Home Medications    Prior to Admission medications   Medication Sig Start Date End Date Taking? Authorizing Provider  acetaminophen (TYLENOL) 650 MG CR tablet Take 650 mg by mouth every 6 (six) hours as needed for pain.   Yes [provider]  Aspirin-Caffeine (BC FAST PAIN RELIEF ARTHRITIS PO) Take 1 packet by mouth See admin instructions. ONE TO SIX TIMES A DAY AS NEEDED FOR PAIN   Yes [provider]  DULoxetine (CYMBALTA) 30 MG capsule Take 90 mg by mouth every morning.  02/14/17  Yes [provider]  Gabapentin Enacarbil (HORIZANT) 600 MG TBCR Take 600 mg by mouth 2 (two) times daily.   Yes [provider]  Gluc-Chonn-MSM-Boswellia-Vit D (GLUCOSAMINE CHOND TRIPLE/VIT D) TABS Take 2 tablets by mouth every morning.   Yes [provider]  hydroxypropyl methylcellulose / hypromellose (ISOPTO TEARS / GONIOVISC) 2.5 %  ophthalmic solution Place 1 drop into both eyes 4 (four) times daily as needed for dry eyes.   Yes [provider]  ibuprofen (ADVIL,MOTRIN) 200 MG tablet Take 800 mg by mouth every 6 (six) hours as needed (for pain).   Yes [provider]  naproxen sodium (ALEVE) 220 MG tablet Take 440 mg by mouth 2 (two) times daily as needed (for pain).   Yes [provider]  sertraline (ZOLOFT) 100 MG tablet Take 100 mg by mouth daily. 12/10/16   Yes [provider]  carisoprodol (SOMA) 350 MG tablet Take 1 tablet (350 mg total) by mouth 3 (three) times daily. Patient not taking: Reported on 07/28/2017 02/17/17   Mesner, Barbara Cower, MD  Diclofenac 18 MG CAPS Take 1 capsule by mouth 2 (two) times daily as needed. Patient not taking: Reported on 07/28/2017 02/17/17   Mesner, Barbara Cower, MD  oxyCODONE (OXY IR/ROXICODONE) 5 MG immediate release tablet Take 1-2 tablets (5-10 mg total) by mouth every 4 (four) hours as needed for breakthrough pain. Patient not taking: Reported on 07/28/2017 03/16/17   Lisbeth Renshaw, MD    Family History No family history on file.  Social History Social History  Substance Use Topics  . Smoking status: Current Every Day Smoker    Packs/day: 1.50  . Smokeless tobacco: Never Used  . Alcohol use Yes     Comment: occasionally     Allergies   Patient has no known allergies.   Review of Systems Review of Systems    Physical Exam Updated Vital Signs BP 113/80 (BP Location: Left Arm)   Pulse 89   Temp 98 F (36.7 C) (Oral)   Resp 15   Ht  (1.575 m)   Wt 120.2 kg   SpO2 100%   BMI 48.47 kg/m   Physical Exam  . General Survey: pleasant, obese, lying in bed comfortably, no acute distress . Skin/Hair/Nails: Skin warm and pink with good turgor. Well-healed surgical incision sites on left abdomen and bilateral lower back.  . Head: Normocephalic and atraumatic.  . Eyes: Sclera white, conjunctiva pink. EOMs intact. PERRLA.  Marland Kitchen Respiratory: Thorax symmetrical and without deformity. Good, equal bilateral chest expansion without use of accessory muscles. Lungs clear to auscultation bilaterally . Cardiovascular: Chest wall without deformity. Heart with regular rate and rhythm. S1 and S2 distinct. No murmurs, rubs, or gallops.  . Abdomen: Active bowel sounds in all quadrants and epigastrium. Soft, non-tender, and non-distended. No palpable masses or hepatosplenomegaly.  . Peripheral Vascular: Right leg  cool to touch. No edema, varicosities, or stasis changes. Calves nontender. Peripheral pulses 2+. . Musculoskeletal: Lumbar spine tender to palpation. No deformity or step-off noted. Strength 5/5 in bilateral feet. . Neurological: Alert, relaxed, and cooperative. Thought process coherent. Oriented to person, place, and time. Detailed cognitive testing deferred.   ED Treatments / Results  Labs (all labs ordered are listed, but only abnormal results are displayed) Labs Reviewed  CBC WITH DIFFERENTIAL/PLATELET  BASIC METABOLIC PANEL    EKG  EKG Interpretation None       Radiology No results found.  Procedures Procedures (including critical care time)  Medications Ordered in ED Medications - No data to display   Initial Impression / Assessment and Plan / ED Course  I have reviewed the triage vital signs and the nursing notes.  Pertinent labs & imaging results that were available during my care of the patient were reviewed by me and considered in my medical decision making (see chart for  details).     Bilateral leg pain, R > L, with history of lumbar fusion. Reports worsening leg pain, weakness, and foot dragging. MRI lumbar spine, CBC, and BMP ordered. Final Clinical Impressions(s) / ED Diagnoses   Final diagnoses:  None    New Prescriptions New Prescriptions   No medications on file

## 2017-07-29 ENCOUNTER — Emergency Department (HOSPITAL_COMMUNITY): Payer: BLUE CROSS/BLUE SHIELD

## 2017-07-29 LAB — BASIC METABOLIC PANEL
Anion gap: 10 (ref 5–15)
BUN: 12 mg/dL (ref 6–20)
CO2: 19 mmol/L — ABNORMAL LOW (ref 22–32)
Calcium: 9 mg/dL (ref 8.9–10.3)
Chloride: 110 mmol/L (ref 101–111)
Creatinine, Ser: 0.7 mg/dL (ref 0.44–1.00)
GFR calc Af Amer: >60 mL/min (ref >60)
GFR calc non Af Amer: >60 mL/min (ref >60)
GLUCOSE: 101 mg/dL — AB (ref 65–99)
POTASSIUM: 3.7 mmol/L (ref 3.5–5.1)
Sodium: 139 mmol/L (ref 135–145)

## 2017-07-29 LAB — CBC WITH DIFFERENTIAL/PLATELET
Basophils Absolute: 0.1 10*3/uL (ref 0.0–0.1)
Basophils Relative: 1 %
EOS PCT: 2 %
Eosinophils Absolute: 0.2 10*3/uL (ref 0.0–0.7)
HCT: 40.2 % (ref 36.0–46.0)
Hemoglobin: 13 g/dL (ref 12.0–15.0)
LYMPHS ABS: 4.2 10*3/uL — AB (ref 0.7–4.0)
LYMPHS PCT: 42 %
MCH: 31 pg (ref 26.0–34.0)
MCHC: 32.3 g/dL (ref 30.0–36.0)
MCV: 95.7 fL (ref 78.0–100.0)
MONO ABS: 1 10*3/uL (ref 0.1–1.0)
Monocytes Relative: 10 %
Neutro Abs: 4.6 10*3/uL (ref 1.7–7.7)
Neutrophils Relative %: 45 %
Platelets: 327 10*3/uL (ref 150–400)
RBC: 4.2 MIL/uL (ref 3.87–5.11)
RDW: 14 % (ref 11.5–15.5)
WBC: 10 10*3/uL (ref 4.0–10.5)

## 2017-07-29 MED ORDER — HYDROCODONE-ACETAMINOPHEN 5-325 MG PO TABS: 1.0000 | ORAL_TABLET | ORAL | 0 refills | Status: AC | PRN

## 2017-07-29 MED ORDER — MORPHINE SULFATE (PF) 4 MG/ML IV SOLN
4.0000 mg | Freq: Once | INTRAVENOUS | Status: AC
  Administered 2017-07-29: 4 mg via INTRAVENOUS
  Filled 2017-07-29: qty 1

## 2017-07-29 MED ORDER — GADOBENATE DIMEGLUMINE 529 MG/ML IV SOLN
15.0000 mL | Freq: Once | INTRAVENOUS | Status: AC | PRN
  Administered 2017-07-29: 15 mL via INTRAVENOUS

## 2017-07-29 MED ORDER — METHYLPREDNISOLONE 4 MG PO TBPK: ORAL_TABLET | ORAL | 0 refills | Status: DC

## 2017-07-29 NOTE — ED Notes (Signed)
Pt returned from MRI °

## 2017-07-29 NOTE — ED Notes (Signed)
EDP at bedside  

## 2017-07-29 NOTE — ED Notes (Signed)
Neuro surgery @ bedside

## 2017-07-29 NOTE — Consult Note (Signed)
Chief Complaint   Chief Complaint  Patient presents with  . Leg Pain    HPI   HPI: Kathleen Hansen is a 46 y.o. female who presented to ER for acute onset RLE pain x2 days. S/P L4-5 OLIF with perc screws by Dr Kathyrn Sheriff 03/15/2017. Prior to surgery, was having severe right medial & lateral thigh pain.  This pain resolved after surgery and she was actually doing well post operatively until about 48 hours ago when she had recurrent symptoms in her RLE. LLE unaffected. Pain is moderate in severity. She has been using multiple OTC meds including NSAIDs, Tylenol, icy-hot without relief. She went to her PCP on Tuesday and was started on Lyrica which has not been helping. Endorses back pain (mild) and weakness at the right hip secondary to pain. Has chronic issues with urinary incontinence secondary to child birth - no worsening. Denies bowel dysfunction.  Patient Active Problem List   Diagnosis Date Noted  . Spondylolisthesis at L4-L5 level 03/15/2017    PMH: Past Medical History:  Diagnosis Date  . Anxiety   . Arthritis    Back  . Back pain   . Depression     PSH: Past Surgical History:  Procedure Laterality Date  . ANTERIOR LAT LUMBAR FUSION N/A 03/15/2017   Procedure: ANTERIOR LATERAL LUMBAR INTERBODY FUSION LUMBAR FOUR- LUMBAR FIVE;  Surgeon: Consuella Lose, MD;  Location: [place];  Service: Neurosurgery;  Laterality: N/A;  . APPENDECTOMY    . APPLICATION OF ROBOTIC ASSISTANCE FOR SPINAL PROCEDURE N/A 03/15/2017   Procedure: APPLICATION OF ROBOTIC ASSISTANCE FOR SPINAL PROCEDURE;  Surgeon: Consuella Lose, MD;  Location: [place];  Service: Neurosurgery;  Laterality: N/A;  . CHOLECYSTECTOMY    . COLONOSCOPY    . DILATION AND CURETTAGE OF UTERUS     pre-cancerous cells removed from uterus  . LUMBAR PERCUTANEOUS PEDICLE SCREW 1 LEVEL N/A 03/15/2017   Procedure: ROBOTIC PERCUTANEOUS PEDICLE SCREW LUMBAR FOUR- LUMBAR FIVE;  Surgeon: Consuella Lose, MD;  Location: [place];  Service:  Neurosurgery;  Laterality: N/A;  . TUBAL LIGATION    . WISDOM TOOTH EXTRACTION       (Not in a hospital admission)  SH: Social History  Substance Use Topics  . Smoking status: Current Every Day Smoker    Packs/day: 1.50  . Smokeless tobacco: Never Used  . Alcohol use Yes     Comment: occasionally    MEDS: Prior to Admission medications   Medication Sig Start Date End Date Taking? Authorizing Provider  acetaminophen (TYLENOL) 650 MG CR tablet Take 650 mg by mouth every 6 (six) hours as needed for pain.   Yes [provider]  Aspirin-Caffeine (BC FAST PAIN RELIEF ARTHRITIS PO) Take 1 packet by mouth See admin instructions. ONE TO SIX TIMES A DAY AS NEEDED FOR PAIN   Yes [provider]  DULoxetine (CYMBALTA) 30 MG capsule Take 90 mg by mouth every morning.  02/14/17  Yes [provider]  Gabapentin Enacarbil (HORIZANT) 600 MG TBCR Take 600 mg by mouth 2 (two) times daily.   Yes [provider]  Gluc-Chonn-MSM-Boswellia-Vit D (GLUCOSAMINE CHOND TRIPLE/VIT D) TABS Take 2 tablets by mouth every morning.   Yes [provider]  hydroxypropyl methylcellulose / hypromellose (ISOPTO TEARS / GONIOVISC) 2.5 % ophthalmic solution Place 1 drop into both eyes 4 (four) times daily as needed for dry eyes.   Yes [provider]  ibuprofen (ADVIL,MOTRIN) 200 MG tablet Take 800 mg by mouth every 6 (six) hours  as needed (for pain).   Yes [provider]  naproxen sodium (ALEVE) 220 MG tablet Take 440 mg by mouth 2 (two) times daily as needed (for pain).   Yes [provider]  sertraline (ZOLOFT) 100 MG tablet Take 100 mg by mouth daily. 12/10/16  Yes [provider]  carisoprodol (SOMA) 350 MG tablet Take 1 tablet (350 mg total) by mouth 3 (three) times daily. Patient not taking: Reported on 07/28/2017 02/17/17   Mesner, Corene Cornea, MD  Diclofenac 18 MG CAPS Take 1 capsule by mouth 2 (two) times daily as needed. Patient not taking:  Reported on 07/28/2017 02/17/17   Mesner, Corene Cornea, MD  HYDROcodone-acetaminophen (NORCO/VICODIN) 5-325 MG tablet Take 1 tablet by mouth every 4 (four) hours as needed for moderate pain. 07/29/17 07/29/18  Nichele Slawson, Vista Mink, PA-C  methylPREDNISolone (MEDROL) 4 MG TBPK tablet Take according to package insert 07/29/17   Joana Nolton, Vista Mink, PA-C  oxyCODONE (OXY IR/ROXICODONE) 5 MG immediate release tablet Take 1-2 tablets (5-10 mg total) by mouth every 4 (four) hours as needed for breakthrough pain. Patient not taking: Reported on 07/28/2017 03/16/17   Consuella Lose, MD    ALLERGY: No Known Allergies  Social History  Substance Use Topics  . Smoking status: Current Every Day Smoker    Packs/day: 1.50  . Smokeless tobacco: Never Used  . Alcohol use Yes     Comment: occasionally     No family history on file.   ROS   Review of Systems  Constitutional: Negative for chills and fever.  HENT: Negative.   Respiratory: Negative.   Cardiovascular: Negative.   Gastrointestinal: Negative for nausea and vomiting.  Genitourinary: Negative.   Musculoskeletal: Positive for back pain, joint pain and myalgias. Negative for falls and neck pain.  Neurological: Negative for dizziness, tingling, tremors, sensory change, speech change, focal weakness, seizures, loss of consciousness and headaches.    Exam   Vitals:   07/29/17 0530 07/29/17 0600  BP: 123/74 113/69  Pulse: 85 76  Resp:  18  Temp:    SpO2: 95% 94%   General appearance: WDWN, NAD, resting comfortably Eyes: PERRL, Fundoscopic: normal Cardiovascular: Regular rate and rhythm without murmurs, rubs, gallops. No edema or variciosities. Distal pulses normal. Pulmonary: Clear to auscultation Musculoskeletal:     Muscle tone upper extremities: Normal    Muscle tone lower extremities: Normal    Motor exam: Upper Extremities Deltoid Bicep Tricep Grip  Right 5/5 5/5 5/5 5/5  Left 5/5 5/5 5/5 5/5   Lower Extremity IP Quad PF DF EHL  Right  Limited strength due to pain, moves anti-gravity Limited strength due to pain, moves anti-gravity 5/5 5/5 5/5  Left 5/5 5/5 5/5 5/5 5/5   Neurological Awake, alert, oriented Memory and concentration grossly intact Speech fluent, appropriate CNII: Visual fields normal CNIII/IV/VI: EOMI CNV: Facial sensation normal CNVII: Symmetric, normal strength CNVIII: Grossly normal CNIX: Normal palate movement CNXI: Trap and SCM strength normal CN XII: Tongue protrusion normal Sensation grossly intact to LT DTR: Normal Coordination (finger/nose & heel/shin): Normal  Results - Imaging/Labs   Results for orders placed or performed during the hospital encounter of 07/28/17 (from the past 48 hour(s))  CBC with Differential/Platelet     Status: Abnormal   Collection Time: 07/29/17 12:07 AM  Result Value Ref Range   WBC 10.0 4.0 - 10.5 K/uL   RBC 4.20 3.87 - 5.11 MIL/uL   Hemoglobin 13.0 12.0 - 15.0 g/dL   HCT 40.2 36.0 - 46.0 %  MCV 95.7 78.0 - 100.0 fL   MCH 31.0 26.0 - 34.0 pg   MCHC 32.3 30.0 - 36.0 g/dL   RDW 14.0 11.5 - 15.5 %   Platelets 327 150 - 400 K/uL   Neutrophils Relative % 45 %   Neutro Abs 4.6 1.7 - 7.7 K/uL   Lymphocytes Relative 42 %   Lymphs Abs 4.2 (H) 0.7 - 4.0 K/uL   Monocytes Relative 10 %   Monocytes Absolute 1.0 0.1 - 1.0 K/uL   Eosinophils Relative 2 %   Eosinophils Absolute 0.2 0.0 - 0.7 K/uL   Basophils Relative 1 %   Basophils Absolute 0.1 0.0 - 0.1 K/uL  Basic metabolic panel     Status: Abnormal   Collection Time: 07/29/17 12:07 AM  Result Value Ref Range   Sodium 139 135 - 145 mmol/L   Potassium 3.7 3.5 - 5.1 mmol/L   Chloride 110 101 - 111 mmol/L   CO2 19 (L) 22 - 32 mmol/L   Glucose, Bld 101 (H) 65 - 99 mg/dL   BUN 12 6 - 20 mg/dL   Creatinine, Ser 0.70 0.44 - 1.00 mg/dL   Calcium 9.0 8.9 - 10.3 mg/dL   GFR calc non Af Amer >60 >60 mL/min   GFR calc Af Amer >60 >60 mL/min    Comment: (NOTE) The eGFR has been calculated using the CKD EPI  equation. This calculation has not been validated in all clinical situations. eGFR's persistently <60 mL/min signify possible Chronic Kidney Disease.    Anion gap 10 5 - 15    Mr Lumbar Spine W Wo Contrast  Result Date: 07/29/2017 CLINICAL DATA:  46 y/o F; rapidly progressive neurologic deficit stranding of the right foot right lower pain. EXAM: MRI LUMBAR SPINE WITHOUT AND WITH CONTRAST TECHNIQUE: Multiplanar and multiecho pulse sequences of the lumbar spine were obtained without and with intravenous contrast. CONTRAST:  32m MULTIHANCE GADOBENATE DIMEGLUMINE 529 MG/ML IV SOLN COMPARISON:  03/04/2017 CT lumbar spine. 02/17/2017 MRI lumbar spine. FINDINGS: Segmentation:  Standard. Alignment: Grade 1 L4-5 anterolisthesis and grade 1 L5-S1 retrolisthesis the is stable. Stable minimal L2-3 retrolisthesis. Vertebrae: L4-5 posterior and interbody fusion. Susceptibility artifact from the fusion hardware partially obscures the vertebral bodies at those levels. No evidence for discitis, acute fracture, or suspicious bone lesion. Trace facet effusions bilaterally at the L2-3 and L3-4 levels. Conus medullaris: Extends to the T12-L1 level and appears normal. Paraspinal and other soft tissues: Negative. Disc levels: L1-2: No significant disc displacement, foraminal narrowing, or canal stenosis. L2-3: Small disc bulge and mild facet hypertrophy. Mild foraminal stenosis. No significant canal stenosis. L3-4: No significant disc displacement. Mild facet and ligamentum flavum hypertrophy. Mild canal and lateral recess stenosis. No significant foraminal stenosis. L4-5: Discectomy infusion. Interval small extrusion of disc material anteriorly with superior migration the (series 3, image 7) resulting in moderate canal stenosis at the L4 vertebral body level the (series 5, image 19) persistent moderate stenosis at the L4-5 intervertebral disc level. Neural foramen are obscured by susceptibility artifact. L5-S1: Stable small  disc bulge eccentric to the left the combined with facet hypertrophy and ligamentum flavum hypertrophy resulting in mild bilateral foraminal stenosis. Disc contact on the descending left S1 nerve root in the left lateral recess is again seen (series 2, image 8). IMPRESSION: 1. Interval small extrusion of L4-5 disc material with superior migration resulting in moderate canal stenosis at the L4 vertebral body level. Persistent moderate stenosis of the L4-5 intervertebral disc level. 2. Stable L5-S1  disc bulge eccentric to the left subarticular zone with contact on descending left S1 nerve root. 3. Stable grade 1 L4-5 anterolisthesis, grade 1 L5-S1 retrolisthesis, and minimal L2-3 retrolisthesis. 4. Postsurgical changes related L4-L5 posterior instrumented and interbody fusion. No discrete fluid collection is identified. 5. No acute osseous abnormality is evident. Electronically Signed   By: Kristine Garbe M.D.   On: 07/29/2017 05:15   Impression/Plan   46 y.o. female with recurrent RLE pain s/p L4-5 OLIF with perc screws by Dr Kathyrn Sheriff 03/15/2017. Pain is identical to the pain she had that required surgery. She is neurologically intact with the exception of some weakness of the RLE secondary to pain. MRI L Spine shows fairly stable findings when compared to MRI in March with the exception of interval small extrusion of L4-5 disc with superior migration resulting in moderate canal stenosis. This may need to be addressed, but not acutely. She can be discharged home. Will schedule for follow up early next week with [facility].   Red flag symptoms discussed at length for when to seek urgent medical attention.   Goal: pain control - Medrol dose pack (written) - Norco 1 tab q 4 hours as necessary for breakthrough pain (written) - Continue Lyrica

## 2017-07-29 NOTE — ED Notes (Signed)
Pt transported to MRI 

## 2017-08-08 ENCOUNTER — Encounter (HOSPITAL_COMMUNITY): Payer: Self-pay | Admitting: Neurosurgery

## 2017-08-19 ENCOUNTER — Other Ambulatory Visit: Payer: Self-pay | Admitting: Physician Assistant

## 2017-08-19 DIAGNOSIS — M4316 Spondylolisthesis, lumbar region: Secondary | ICD-10-CM

## 2017-10-18 DIAGNOSIS — M5417 Radiculopathy, lumbosacral region: Secondary | ICD-10-CM | POA: Diagnosis not present

## 2017-10-18 DIAGNOSIS — F329 Major depressive disorder, single episode, unspecified: Secondary | ICD-10-CM | POA: Diagnosis not present

## 2017-10-18 DIAGNOSIS — M1611 Unilateral primary osteoarthritis, right hip: Secondary | ICD-10-CM | POA: Diagnosis not present

## 2017-10-18 DIAGNOSIS — F419 Anxiety disorder, unspecified: Secondary | ICD-10-CM | POA: Diagnosis not present

## 2017-11-11 IMAGING — CT CT L SPINE W/O CM
3 of 4 series · 12 of 33 positions shown, 15 images · non-contrast
Comparison: Comparison made with recent MRI from 02/17/2017.

CLINICAL DATA: Initial evaluation for chronic back pain with
spondylolisthesis. [place] protocol for spinal surgical planning.

EXAM:
CT LUMBAR SPINE WITHOUT CONTRAST
TECHNIQUE: Multidetector CT imaging of the lumbar spine was performed without
intravenous contrast administration. Multiplanar CT image
reconstructions were also generated.

[Series 7: lumbar wo 2.0 · coronal · 0.39mm/px · 1 of 86 slices shown (1 of 2)]
[im 43/86  bone]
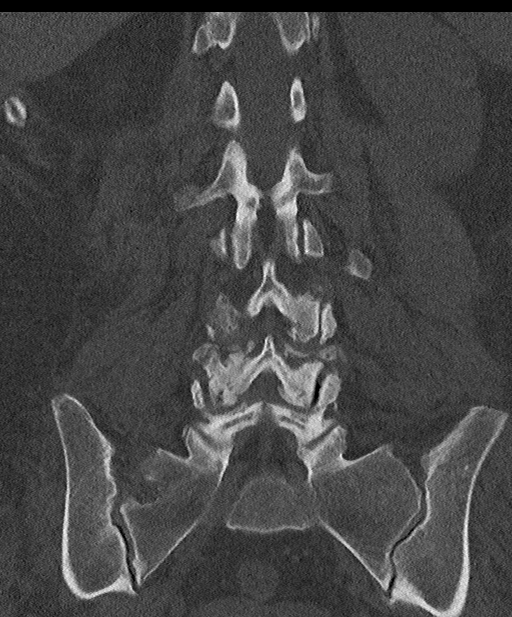

[Series 8: lumbar wo axial 2.0 · axial · 0.39mm/px · z∈[-416,-236]mm · 6 of 119 slices shown, 8 images]
[im 19/119  soft-tissue]
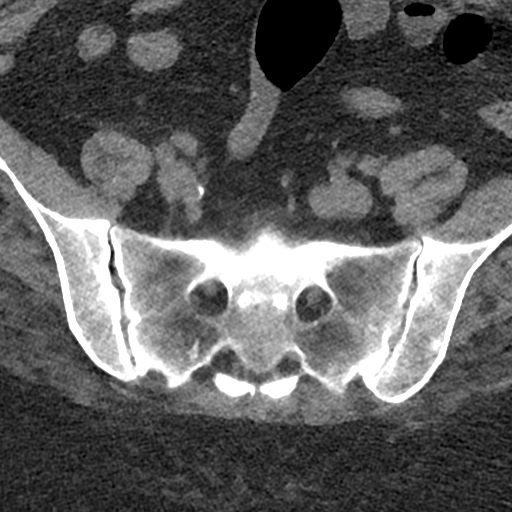
[im 19/119  bone]
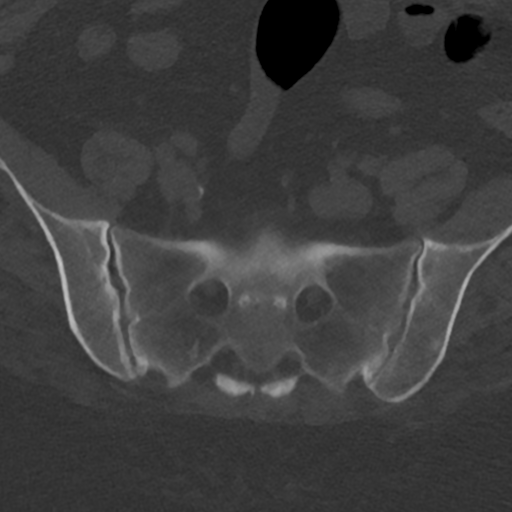
[im 37/119  bone]
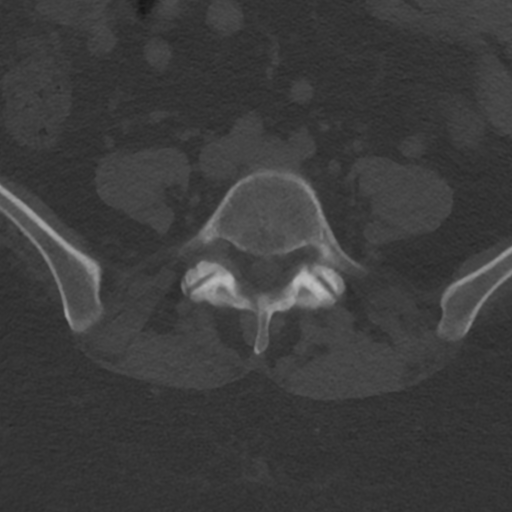
[im 55/119  bone]
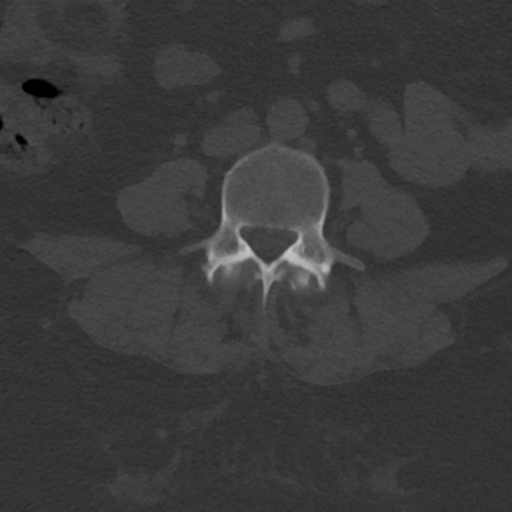
[im 73/119  bone]
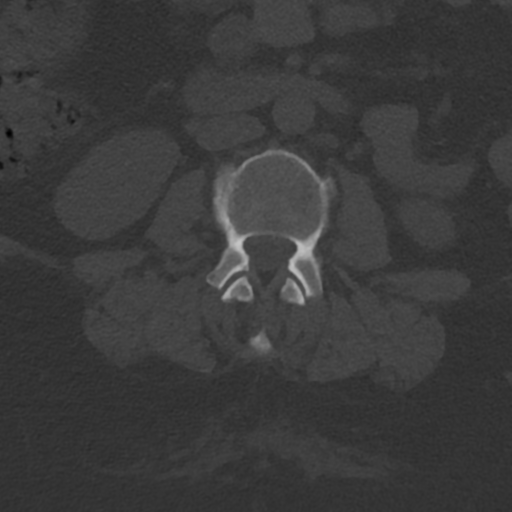
[im 91/119  soft-tissue]
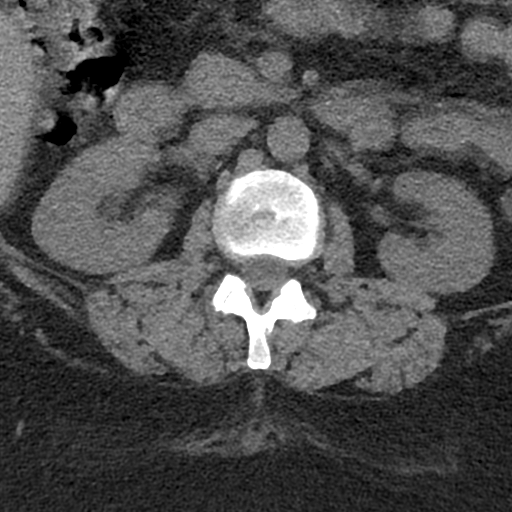
[im 91/119  bone]
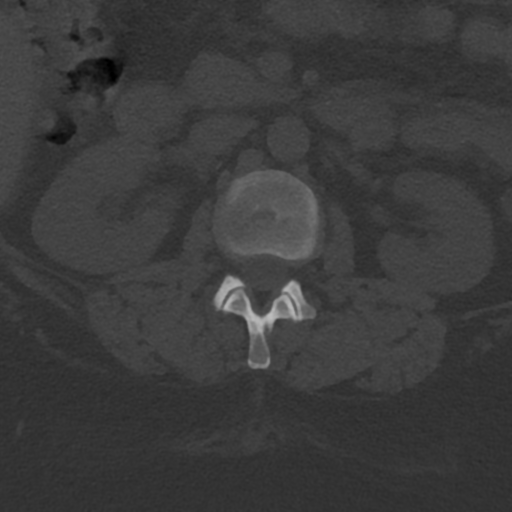
[im 109/119  bone]
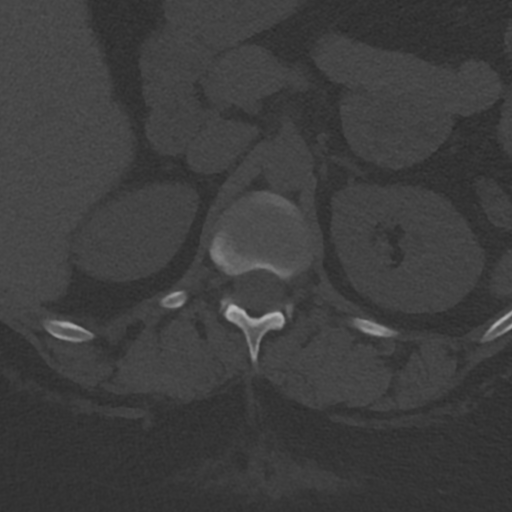

[Series 9: lumbar wo 2.0 · sagittal · 0.31mm/px · 5 of 101 slices shown, 6 images (2 of 2)]
[im 34/101  bone]
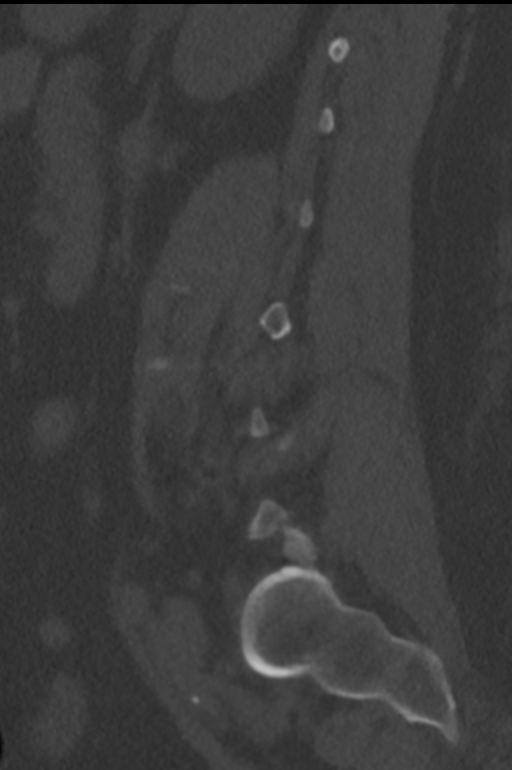
[im 42/101  bone]
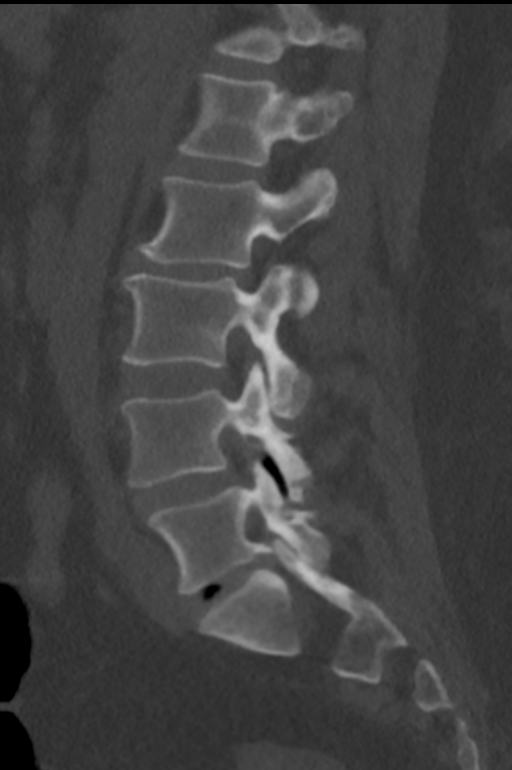
[im 51/101  soft-tissue]
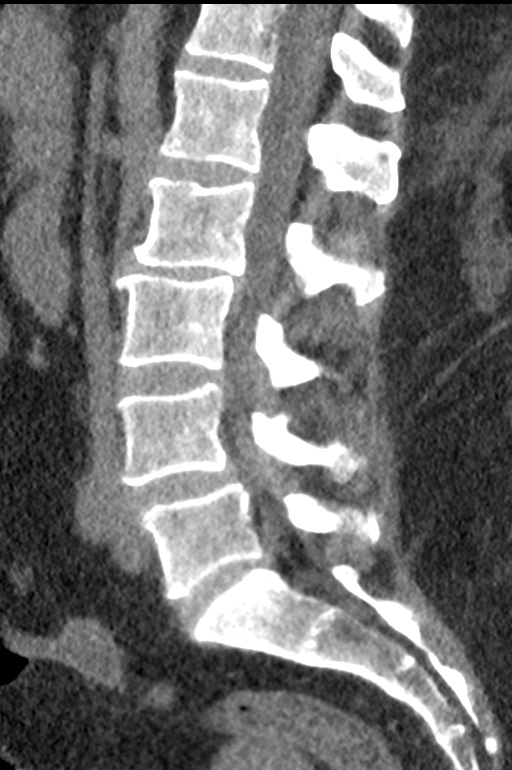
[im 51/101  bone]
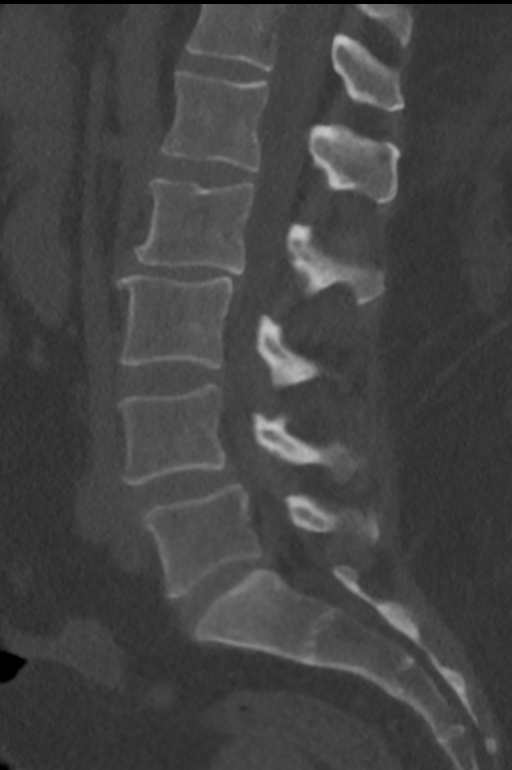
[im 59/101  bone]
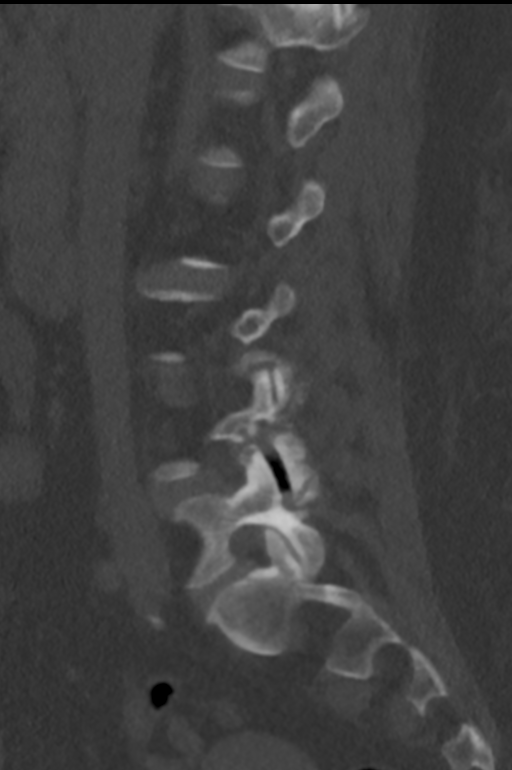
[im 67/101  bone]
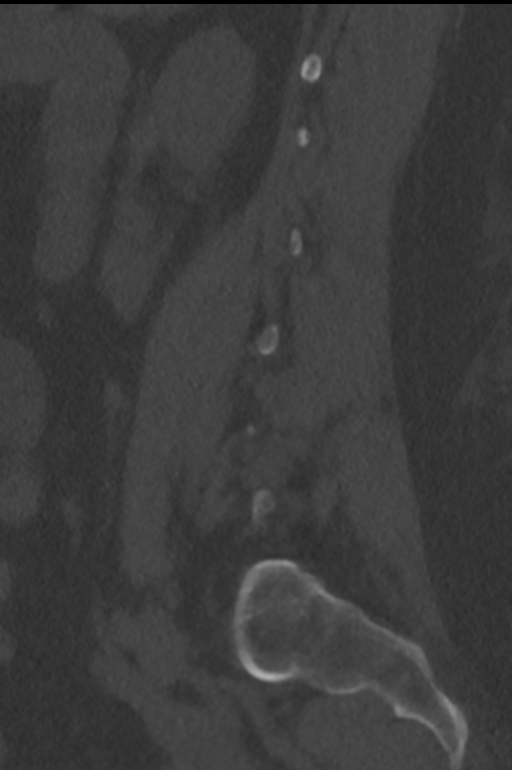

[12 of 33 positions shown; findings below may reference images not displayed]

FINDINGS: Segmentation: Normal segmentation. Lowest well-formed disc is
labeled the L5-S1 level.

Alignment: Chronic bilateral pars defects at L4 with associated 2-3
mm spondylolisthesis of L4 on L5. Trace 3 mm retrolisthesis of L2 on
L3. Normal lumbar lordosis otherwise maintained.

Vertebrae: Vertebral body heights maintained. No acute or chronic
fracture. No focal osseous lesions.

Paraspinal and other soft tissues: Paraspinous soft tissues
demonstrate no acute abnormality. Mild atheromatous plaque within
the iliac vessels bilaterally. Patient is status post
cholecystectomy. Visualized visceral structures otherwise
unremarkable.

Disc levels:

L1-2:  Normal disc.  No stenosis or neural impingement.

L2-3: Chronic 3 mm retrolisthesis of L2 on L3. Disc bulge with
intervertebral disc space narrowing. Mild facet hypertrophy. No
significant stenosis or neural impingement.

L3-4: No appreciable disc bulge. Mild bilateral facet arthrosis with
ligamentum flavum hypertrophy. No significant canal or foraminal
stenosis.

L4-5: 2-3 mm spondylolisthesis. A advanced bilateral facet arthrosis
with ligamentum flavum hypertrophy. Associated broad disc bulge.
Moderate canal and bilateral subarticular stenosis is relatively
stable from previous. Mild 5 lateral L4 foraminal stenosis.

L5-S1: Disc bulge with disc desiccation and intervertebral disc
space narrowing. Left central disc osteophyte complex, extending
into the neural foramen (series 8, image 86). Mild bilateral facet
hypertrophy. Possible impingement of the left L5 and S1 nerve roots.
Moderate L5 foraminal stenosis.
IMPRESSION: THIS MAY:
IMPRESSION: THIS [DATE]. Chronic 2-3 mm spondylolisthesis of L4 on L5 with severe facet
arthrosis, with resultant moderate canal and subarticular stenosis.
2. Central and leftward disc osteophyte complex at L5-S1, with
probable left L5 and S1 nerve root impingement.
3. 3 mm retrolisthesis of L2 on L3 with associated facet
degeneration. No stenosis.
4. Study will be used for surgical planning purposes.

## 2017-12-20 DIAGNOSIS — Z8781 Personal history of (healed) traumatic fracture: Secondary | ICD-10-CM | POA: Diagnosis not present

## 2017-12-20 DIAGNOSIS — M5416 Radiculopathy, lumbar region: Secondary | ICD-10-CM | POA: Diagnosis not present

## 2017-12-20 DIAGNOSIS — Z981 Arthrodesis status: Secondary | ICD-10-CM | POA: Diagnosis not present

## 2017-12-21 DIAGNOSIS — M25551 Pain in right hip: Secondary | ICD-10-CM | POA: Diagnosis not present

## 2017-12-21 DIAGNOSIS — M1611 Unilateral primary osteoarthritis, right hip: Secondary | ICD-10-CM | POA: Diagnosis not present

## 2018-01-17 DIAGNOSIS — M5136 Other intervertebral disc degeneration, lumbar region: Secondary | ICD-10-CM | POA: Diagnosis not present

## 2018-01-17 DIAGNOSIS — G8929 Other chronic pain: Secondary | ICD-10-CM | POA: Diagnosis not present

## 2018-01-17 DIAGNOSIS — Z8781 Personal history of (healed) traumatic fracture: Secondary | ICD-10-CM | POA: Diagnosis not present

## 2018-01-17 DIAGNOSIS — M150 Primary generalized (osteo)arthritis: Secondary | ICD-10-CM | POA: Diagnosis not present

## 2018-02-08 DIAGNOSIS — M1611 Unilateral primary osteoarthritis, right hip: Secondary | ICD-10-CM | POA: Diagnosis not present

## 2018-02-08 DIAGNOSIS — M25551 Pain in right hip: Secondary | ICD-10-CM | POA: Diagnosis not present

## 2018-02-16 DIAGNOSIS — Z6841 Body Mass Index (BMI) 40.0 and over, adult: Secondary | ICD-10-CM | POA: Diagnosis not present

## 2018-02-16 DIAGNOSIS — F329 Major depressive disorder, single episode, unspecified: Secondary | ICD-10-CM | POA: Diagnosis not present

## 2018-02-16 DIAGNOSIS — F419 Anxiety disorder, unspecified: Secondary | ICD-10-CM | POA: Diagnosis not present

## 2018-03-07 DIAGNOSIS — Z13220 Encounter for screening for lipoid disorders: Secondary | ICD-10-CM | POA: Diagnosis not present

## 2018-03-07 DIAGNOSIS — Z1329 Encounter for screening for other suspected endocrine disorder: Secondary | ICD-10-CM | POA: Diagnosis not present

## 2018-04-04 DIAGNOSIS — M5416 Radiculopathy, lumbar region: Secondary | ICD-10-CM | POA: Diagnosis not present

## 2018-04-04 DIAGNOSIS — Z8781 Personal history of (healed) traumatic fracture: Secondary | ICD-10-CM | POA: Diagnosis not present

## 2018-04-04 DIAGNOSIS — G8929 Other chronic pain: Secondary | ICD-10-CM | POA: Diagnosis not present

## 2018-04-04 DIAGNOSIS — Z981 Arthrodesis status: Secondary | ICD-10-CM | POA: Diagnosis not present

## 2018-06-01 DIAGNOSIS — Z6841 Body Mass Index (BMI) 40.0 and over, adult: Secondary | ICD-10-CM | POA: Diagnosis not present

## 2018-06-01 DIAGNOSIS — F329 Major depressive disorder, single episode, unspecified: Secondary | ICD-10-CM | POA: Diagnosis not present

## 2018-06-01 DIAGNOSIS — R030 Elevated blood-pressure reading, without diagnosis of hypertension: Secondary | ICD-10-CM | POA: Diagnosis not present

## 2018-06-01 DIAGNOSIS — F419 Anxiety disorder, unspecified: Secondary | ICD-10-CM | POA: Diagnosis not present

## 2018-06-20 DIAGNOSIS — R030 Elevated blood-pressure reading, without diagnosis of hypertension: Secondary | ICD-10-CM | POA: Diagnosis not present

## 2018-06-27 DIAGNOSIS — E039 Hypothyroidism, unspecified: Secondary | ICD-10-CM | POA: Diagnosis not present

## 2018-06-27 DIAGNOSIS — R030 Elevated blood-pressure reading, without diagnosis of hypertension: Secondary | ICD-10-CM | POA: Diagnosis not present

## 2018-06-27 DIAGNOSIS — Z6841 Body Mass Index (BMI) 40.0 and over, adult: Secondary | ICD-10-CM | POA: Diagnosis not present

## 2018-06-27 DIAGNOSIS — Z1231 Encounter for screening mammogram for malignant neoplasm of breast: Secondary | ICD-10-CM | POA: Diagnosis not present

## 2018-08-01 DIAGNOSIS — R030 Elevated blood-pressure reading, without diagnosis of hypertension: Secondary | ICD-10-CM | POA: Diagnosis not present

## 2018-08-01 DIAGNOSIS — E039 Hypothyroidism, unspecified: Secondary | ICD-10-CM | POA: Diagnosis not present

## 2018-08-08 DIAGNOSIS — Z23 Encounter for immunization: Secondary | ICD-10-CM | POA: Diagnosis not present

## 2018-08-08 DIAGNOSIS — F419 Anxiety disorder, unspecified: Secondary | ICD-10-CM | POA: Diagnosis not present

## 2018-08-08 DIAGNOSIS — E039 Hypothyroidism, unspecified: Secondary | ICD-10-CM | POA: Diagnosis not present

## 2018-08-08 DIAGNOSIS — F329 Major depressive disorder, single episode, unspecified: Secondary | ICD-10-CM | POA: Diagnosis not present

## 2018-08-08 DIAGNOSIS — R030 Elevated blood-pressure reading, without diagnosis of hypertension: Secondary | ICD-10-CM | POA: Diagnosis not present

## 2019-02-14 DIAGNOSIS — F329 Major depressive disorder, single episode, unspecified: Secondary | ICD-10-CM | POA: Diagnosis not present

## 2019-02-14 DIAGNOSIS — I10 Essential (primary) hypertension: Secondary | ICD-10-CM | POA: Diagnosis not present

## 2019-02-14 DIAGNOSIS — Z79899 Other long term (current) drug therapy: Secondary | ICD-10-CM | POA: Diagnosis not present

## 2019-02-14 DIAGNOSIS — Z1331 Encounter for screening for depression: Secondary | ICD-10-CM | POA: Diagnosis not present

## 2019-02-14 DIAGNOSIS — E039 Hypothyroidism, unspecified: Secondary | ICD-10-CM | POA: Diagnosis not present

## 2019-04-25 DIAGNOSIS — Z03818 Encounter for observation for suspected exposure to other biological agents ruled out: Secondary | ICD-10-CM | POA: Diagnosis not present

## 2019-05-02 DIAGNOSIS — Z03818 Encounter for observation for suspected exposure to other biological agents ruled out: Secondary | ICD-10-CM | POA: Diagnosis not present

## 2019-05-08 DIAGNOSIS — Z03818 Encounter for observation for suspected exposure to other biological agents ruled out: Secondary | ICD-10-CM | POA: Diagnosis not present

## 2019-05-14 DIAGNOSIS — M25711 Osteophyte, right shoulder: Secondary | ICD-10-CM | POA: Diagnosis not present

## 2019-05-14 DIAGNOSIS — R102 Pelvic and perineal pain: Secondary | ICD-10-CM | POA: Diagnosis not present

## 2019-05-14 DIAGNOSIS — M898X5 Other specified disorders of bone, thigh: Secondary | ICD-10-CM | POA: Diagnosis not present

## 2019-05-14 DIAGNOSIS — M25551 Pain in right hip: Secondary | ICD-10-CM | POA: Diagnosis not present

## 2019-05-14 DIAGNOSIS — M85651 Other cyst of bone, right thigh: Secondary | ICD-10-CM | POA: Diagnosis not present

## 2019-05-14 DIAGNOSIS — M1611 Unilateral primary osteoarthritis, right hip: Secondary | ICD-10-CM | POA: Diagnosis not present

## 2019-05-15 DIAGNOSIS — Z03818 Encounter for observation for suspected exposure to other biological agents ruled out: Secondary | ICD-10-CM | POA: Diagnosis not present

## 2019-05-28 DIAGNOSIS — S79911A Unspecified injury of right hip, initial encounter: Secondary | ICD-10-CM | POA: Diagnosis not present

## 2019-05-28 DIAGNOSIS — E039 Hypothyroidism, unspecified: Secondary | ICD-10-CM | POA: Diagnosis not present

## 2019-05-28 DIAGNOSIS — Z79899 Other long term (current) drug therapy: Secondary | ICD-10-CM | POA: Diagnosis not present

## 2019-05-28 DIAGNOSIS — I10 Essential (primary) hypertension: Secondary | ICD-10-CM | POA: Diagnosis not present

## 2019-05-28 DIAGNOSIS — Z136 Encounter for screening for cardiovascular disorders: Secondary | ICD-10-CM | POA: Diagnosis not present

## 2019-05-28 DIAGNOSIS — F329 Major depressive disorder, single episode, unspecified: Secondary | ICD-10-CM | POA: Diagnosis not present

## 2019-05-31 DIAGNOSIS — Z03818 Encounter for observation for suspected exposure to other biological agents ruled out: Secondary | ICD-10-CM | POA: Diagnosis not present

## 2019-06-01 DIAGNOSIS — Z03818 Encounter for observation for suspected exposure to other biological agents ruled out: Secondary | ICD-10-CM | POA: Diagnosis not present

## 2019-06-12 DIAGNOSIS — Z87891 Personal history of nicotine dependence: Secondary | ICD-10-CM | POA: Diagnosis not present

## 2019-06-12 DIAGNOSIS — M1611 Unilateral primary osteoarthritis, right hip: Secondary | ICD-10-CM | POA: Diagnosis not present

## 2019-06-12 DIAGNOSIS — Z03818 Encounter for observation for suspected exposure to other biological agents ruled out: Secondary | ICD-10-CM | POA: Diagnosis not present

## 2019-06-12 DIAGNOSIS — G894 Chronic pain syndrome: Secondary | ICD-10-CM | POA: Diagnosis not present

## 2019-06-20 DIAGNOSIS — Z03818 Encounter for observation for suspected exposure to other biological agents ruled out: Secondary | ICD-10-CM | POA: Diagnosis not present

## 2019-06-27 DIAGNOSIS — Z03818 Encounter for observation for suspected exposure to other biological agents ruled out: Secondary | ICD-10-CM | POA: Diagnosis not present

## 2019-07-03 DIAGNOSIS — Z03818 Encounter for observation for suspected exposure to other biological agents ruled out: Secondary | ICD-10-CM | POA: Diagnosis not present

## 2019-07-05 DIAGNOSIS — M1611 Unilateral primary osteoarthritis, right hip: Secondary | ICD-10-CM | POA: Diagnosis not present

## 2019-07-12 DIAGNOSIS — Z79899 Other long term (current) drug therapy: Secondary | ICD-10-CM | POA: Diagnosis not present

## 2019-07-24 DIAGNOSIS — M1611 Unilateral primary osteoarthritis, right hip: Secondary | ICD-10-CM | POA: Diagnosis not present

## 2019-07-31 DIAGNOSIS — Z03818 Encounter for observation for suspected exposure to other biological agents ruled out: Secondary | ICD-10-CM | POA: Diagnosis not present

## 2019-08-07 DIAGNOSIS — Z03818 Encounter for observation for suspected exposure to other biological agents ruled out: Secondary | ICD-10-CM | POA: Diagnosis not present

## 2019-08-15 DIAGNOSIS — Z03818 Encounter for observation for suspected exposure to other biological agents ruled out: Secondary | ICD-10-CM | POA: Diagnosis not present

## 2019-08-23 DIAGNOSIS — M5441 Lumbago with sciatica, right side: Secondary | ICD-10-CM | POA: Diagnosis not present

## 2019-08-28 DIAGNOSIS — E039 Hypothyroidism, unspecified: Secondary | ICD-10-CM | POA: Diagnosis not present

## 2019-08-28 DIAGNOSIS — I10 Essential (primary) hypertension: Secondary | ICD-10-CM | POA: Diagnosis not present

## 2019-08-28 DIAGNOSIS — M1611 Unilateral primary osteoarthritis, right hip: Secondary | ICD-10-CM | POA: Diagnosis not present

## 2019-08-28 DIAGNOSIS — E785 Hyperlipidemia, unspecified: Secondary | ICD-10-CM | POA: Diagnosis not present

## 2019-08-28 DIAGNOSIS — Z23 Encounter for immunization: Secondary | ICD-10-CM | POA: Diagnosis not present

## 2019-09-05 DIAGNOSIS — Z03818 Encounter for observation for suspected exposure to other biological agents ruled out: Secondary | ICD-10-CM | POA: Diagnosis not present

## 2019-09-10 DIAGNOSIS — Z03818 Encounter for observation for suspected exposure to other biological agents ruled out: Secondary | ICD-10-CM | POA: Diagnosis not present

## 2019-09-13 DIAGNOSIS — M1611 Unilateral primary osteoarthritis, right hip: Secondary | ICD-10-CM | POA: Diagnosis not present

## 2019-09-17 DIAGNOSIS — Z03818 Encounter for observation for suspected exposure to other biological agents ruled out: Secondary | ICD-10-CM | POA: Diagnosis not present

## 2019-10-02 DIAGNOSIS — Z03818 Encounter for observation for suspected exposure to other biological agents ruled out: Secondary | ICD-10-CM | POA: Diagnosis not present

## 2019-10-08 DIAGNOSIS — Z03818 Encounter for observation for suspected exposure to other biological agents ruled out: Secondary | ICD-10-CM | POA: Diagnosis not present

## 2019-10-10 DIAGNOSIS — Z03818 Encounter for observation for suspected exposure to other biological agents ruled out: Secondary | ICD-10-CM | POA: Diagnosis not present

## 2019-10-15 DIAGNOSIS — Z03818 Encounter for observation for suspected exposure to other biological agents ruled out: Secondary | ICD-10-CM | POA: Diagnosis not present

## 2019-10-22 DIAGNOSIS — Z03818 Encounter for observation for suspected exposure to other biological agents ruled out: Secondary | ICD-10-CM | POA: Diagnosis not present

## 2019-10-24 DIAGNOSIS — Z03818 Encounter for observation for suspected exposure to other biological agents ruled out: Secondary | ICD-10-CM | POA: Diagnosis not present

## 2019-10-24 DIAGNOSIS — Z79899 Other long term (current) drug therapy: Secondary | ICD-10-CM | POA: Diagnosis not present

## 2019-10-24 DIAGNOSIS — E039 Hypothyroidism, unspecified: Secondary | ICD-10-CM | POA: Diagnosis not present

## 2019-10-24 DIAGNOSIS — M1611 Unilateral primary osteoarthritis, right hip: Secondary | ICD-10-CM | POA: Diagnosis not present

## 2019-10-29 DIAGNOSIS — Z03818 Encounter for observation for suspected exposure to other biological agents ruled out: Secondary | ICD-10-CM | POA: Diagnosis not present

## 2019-10-31 DIAGNOSIS — Z03818 Encounter for observation for suspected exposure to other biological agents ruled out: Secondary | ICD-10-CM | POA: Diagnosis not present

## 2019-11-06 DIAGNOSIS — G8929 Other chronic pain: Secondary | ICD-10-CM | POA: Diagnosis not present

## 2019-11-06 DIAGNOSIS — M25551 Pain in right hip: Secondary | ICD-10-CM | POA: Diagnosis not present

## 2019-11-06 DIAGNOSIS — M5116 Intervertebral disc disorders with radiculopathy, lumbar region: Secondary | ICD-10-CM | POA: Diagnosis not present

## 2019-11-06 DIAGNOSIS — Z981 Arthrodesis status: Secondary | ICD-10-CM | POA: Diagnosis not present

## 2019-11-07 DIAGNOSIS — Z03818 Encounter for observation for suspected exposure to other biological agents ruled out: Secondary | ICD-10-CM | POA: Diagnosis not present

## 2019-11-12 DIAGNOSIS — Z03818 Encounter for observation for suspected exposure to other biological agents ruled out: Secondary | ICD-10-CM | POA: Diagnosis not present

## 2019-11-19 DIAGNOSIS — Z03818 Encounter for observation for suspected exposure to other biological agents ruled out: Secondary | ICD-10-CM | POA: Diagnosis not present

## 2019-11-22 DIAGNOSIS — M1611 Unilateral primary osteoarthritis, right hip: Secondary | ICD-10-CM | POA: Diagnosis not present

## 2019-11-28 DIAGNOSIS — I10 Essential (primary) hypertension: Secondary | ICD-10-CM | POA: Diagnosis not present

## 2019-11-28 DIAGNOSIS — E785 Hyperlipidemia, unspecified: Secondary | ICD-10-CM | POA: Diagnosis not present

## 2019-11-28 DIAGNOSIS — E039 Hypothyroidism, unspecified: Secondary | ICD-10-CM | POA: Diagnosis not present

## 2019-12-06 DIAGNOSIS — Z79891 Long term (current) use of opiate analgesic: Secondary | ICD-10-CM | POA: Diagnosis not present

## 2019-12-06 DIAGNOSIS — G8929 Other chronic pain: Secondary | ICD-10-CM | POA: Diagnosis not present

## 2019-12-06 DIAGNOSIS — M25551 Pain in right hip: Secondary | ICD-10-CM | POA: Diagnosis not present

## 2019-12-06 DIAGNOSIS — M5416 Radiculopathy, lumbar region: Secondary | ICD-10-CM | POA: Diagnosis not present

## 2019-12-17 DIAGNOSIS — M1611 Unilateral primary osteoarthritis, right hip: Secondary | ICD-10-CM | POA: Diagnosis not present

## 2020-01-03 DIAGNOSIS — M25522 Pain in left elbow: Secondary | ICD-10-CM | POA: Diagnosis not present

## 2020-01-03 DIAGNOSIS — M7981 Nontraumatic hematoma of soft tissue: Secondary | ICD-10-CM | POA: Diagnosis not present

## 2020-01-03 DIAGNOSIS — M19022 Primary osteoarthritis, left elbow: Secondary | ICD-10-CM | POA: Diagnosis not present

## 2020-02-27 DIAGNOSIS — I10 Essential (primary) hypertension: Secondary | ICD-10-CM | POA: Diagnosis not present

## 2020-02-27 DIAGNOSIS — E785 Hyperlipidemia, unspecified: Secondary | ICD-10-CM | POA: Diagnosis not present

## 2020-02-27 DIAGNOSIS — E039 Hypothyroidism, unspecified: Secondary | ICD-10-CM | POA: Diagnosis not present

## 2020-03-04 DIAGNOSIS — Z981 Arthrodesis status: Secondary | ICD-10-CM | POA: Diagnosis not present

## 2020-03-04 DIAGNOSIS — G8929 Other chronic pain: Secondary | ICD-10-CM | POA: Diagnosis not present

## 2020-03-04 DIAGNOSIS — M5416 Radiculopathy, lumbar region: Secondary | ICD-10-CM | POA: Diagnosis not present

## 2020-03-04 DIAGNOSIS — M25551 Pain in right hip: Secondary | ICD-10-CM | POA: Diagnosis not present

## 2020-03-17 DIAGNOSIS — M1611 Unilateral primary osteoarthritis, right hip: Secondary | ICD-10-CM | POA: Diagnosis not present

## 2020-04-18 DIAGNOSIS — Z1231 Encounter for screening mammogram for malignant neoplasm of breast: Secondary | ICD-10-CM | POA: Diagnosis not present

## 2020-05-29 DIAGNOSIS — E039 Hypothyroidism, unspecified: Secondary | ICD-10-CM | POA: Diagnosis not present

## 2020-05-29 DIAGNOSIS — E785 Hyperlipidemia, unspecified: Secondary | ICD-10-CM | POA: Diagnosis not present

## 2020-05-29 DIAGNOSIS — F329 Major depressive disorder, single episode, unspecified: Secondary | ICD-10-CM | POA: Diagnosis not present

## 2020-05-29 DIAGNOSIS — I10 Essential (primary) hypertension: Secondary | ICD-10-CM | POA: Diagnosis not present

## 2020-06-03 DIAGNOSIS — G8929 Other chronic pain: Secondary | ICD-10-CM | POA: Diagnosis not present

## 2020-06-03 DIAGNOSIS — M25551 Pain in right hip: Secondary | ICD-10-CM | POA: Diagnosis not present

## 2020-06-03 DIAGNOSIS — Z79891 Long term (current) use of opiate analgesic: Secondary | ICD-10-CM | POA: Diagnosis not present

## 2020-06-03 DIAGNOSIS — M5416 Radiculopathy, lumbar region: Secondary | ICD-10-CM | POA: Diagnosis not present

## 2020-06-03 DIAGNOSIS — Z981 Arthrodesis status: Secondary | ICD-10-CM | POA: Diagnosis not present

## 2020-06-19 DIAGNOSIS — M1611 Unilateral primary osteoarthritis, right hip: Secondary | ICD-10-CM | POA: Diagnosis not present

## 2020-08-28 DIAGNOSIS — Z79891 Long term (current) use of opiate analgesic: Secondary | ICD-10-CM | POA: Diagnosis not present

## 2020-08-28 DIAGNOSIS — G8929 Other chronic pain: Secondary | ICD-10-CM | POA: Diagnosis not present

## 2020-08-28 DIAGNOSIS — Z981 Arthrodesis status: Secondary | ICD-10-CM | POA: Diagnosis not present

## 2020-08-28 DIAGNOSIS — M5416 Radiculopathy, lumbar region: Secondary | ICD-10-CM | POA: Diagnosis not present

## 2020-08-28 DIAGNOSIS — M1611 Unilateral primary osteoarthritis, right hip: Secondary | ICD-10-CM | POA: Diagnosis not present

## 2020-09-16 DIAGNOSIS — M1611 Unilateral primary osteoarthritis, right hip: Secondary | ICD-10-CM | POA: Diagnosis not present

## 2020-09-30 DIAGNOSIS — Z1331 Encounter for screening for depression: Secondary | ICD-10-CM | POA: Diagnosis not present

## 2020-09-30 DIAGNOSIS — I10 Essential (primary) hypertension: Secondary | ICD-10-CM | POA: Diagnosis not present

## 2020-09-30 DIAGNOSIS — E039 Hypothyroidism, unspecified: Secondary | ICD-10-CM | POA: Diagnosis not present

## 2020-09-30 DIAGNOSIS — E785 Hyperlipidemia, unspecified: Secondary | ICD-10-CM | POA: Diagnosis not present

## 2021-02-17 DIAGNOSIS — I2699 Other pulmonary embolism without acute cor pulmonale: Secondary | ICD-10-CM

## 2021-03-03 ENCOUNTER — Telehealth: Payer: Self-pay | Admitting: Oncology

## 2021-03-03 NOTE — Telephone Encounter (Signed)
Patient referred by Wenda Low, NP for Iron Def/Anemia.  Appt made for 03/18/21 labs 10:30 am - Consult 11:00 am

## 2021-03-18 ENCOUNTER — Other Ambulatory Visit: Payer: Self-pay

## 2021-03-18 ENCOUNTER — Inpatient Hospital Stay (HOSPITAL_BASED_OUTPATIENT_CLINIC_OR_DEPARTMENT_OTHER): Payer: Self-pay | Admitting: Oncology

## 2021-03-18 ENCOUNTER — Telehealth: Payer: Self-pay | Admitting: Oncology

## 2021-03-18 ENCOUNTER — Other Ambulatory Visit: Payer: Self-pay | Admitting: Hematology and Oncology

## 2021-03-18 ENCOUNTER — Other Ambulatory Visit: Payer: Self-pay | Admitting: Oncology

## 2021-03-18 ENCOUNTER — Inpatient Hospital Stay: Payer: Self-pay | Attending: Oncology

## 2021-03-18 DIAGNOSIS — D539 Nutritional anemia, unspecified: Secondary | ICD-10-CM | POA: Insufficient documentation

## 2021-03-18 DIAGNOSIS — D649 Anemia, unspecified: Secondary | ICD-10-CM

## 2021-03-18 LAB — COMPREHENSIVE METABOLIC PANEL
Albumin: 4.3 (ref 3.5–5.0)
Calcium: 9.2 (ref 8.7–10.7)

## 2021-03-18 LAB — IRON AND TIBC
Iron: 69 ug/dL (ref 28–170)
Saturation Ratios: 16 % (ref 10.4–31.8)
TIBC: 424 ug/dL (ref 250–450)
UIBC: 355 ug/dL

## 2021-03-18 LAB — FOLATE: Folate: 8.8 ng/mL (ref >5.9)

## 2021-03-18 LAB — BASIC METABOLIC PANEL
BUN: 11 (ref 4–21)
CO2: 30 — AB (ref 13–22)
Chloride: 104 (ref 99–108)
Creatinine: 0.6 (ref 0.5–1.1)
Glucose: 103
Potassium: 3.9 (ref 3.4–5.3)
Sodium: 138 (ref 137–147)

## 2021-03-18 LAB — LACTATE DEHYDROGENASE: LDH: 206 U/L — ABNORMAL HIGH (ref 98–192)

## 2021-03-18 LAB — FERRITIN: Ferritin: 94 ng/mL (ref 11–307)

## 2021-03-18 LAB — RETICULOCYTES
Immature Retic Fract: 30.9 % — ABNORMAL HIGH (ref 2.3–15.9)
RBC.: 3.57 MIL/uL — ABNORMAL LOW (ref 3.87–5.11)
Retic Count, Absolute: 206 10*3/uL — ABNORMAL HIGH (ref 19.0–186.0)
Retic Ct Pct: 5.8 % — ABNORMAL HIGH (ref 0.4–3.1)

## 2021-03-18 LAB — HEPATIC FUNCTION PANEL
ALT: 23 (ref 7–35)
AST: 25 (ref 13–35)
Alkaline Phosphatase: 80 (ref 25–125)
Bilirubin, Total: 0.3

## 2021-03-18 LAB — CBC AND DIFFERENTIAL
HCT: 37 (ref 36–46)
Hemoglobin: 12.5 (ref 12.0–16.0)
Neutrophils Absolute: 3.24
Platelets: 372 (ref 150–399)
WBC: 6.9

## 2021-03-18 LAB — DIRECT ANTIGLOBULIN TEST (NOT AT ARMC)
DAT, IgG: NEGATIVE
DAT, complement: NEGATIVE

## 2021-03-18 LAB — VITAMIN B12: Vitamin B-12: 421 pg/mL (ref 180–914)

## 2021-03-18 LAB — CBC: RBC: 3.47 — AB (ref 3.87–5.11)

## 2021-03-18 NOTE — Telephone Encounter (Signed)
Per 4/27 los next appt scheduled and confirmed with patient 

## 2021-03-18 NOTE — Progress Notes (Signed)
[facility]  [address] [place],  [place]  [address] [phone]  Clinic Day:  03/18/2021  Referring physician: Wenda Low, NP   HISTORY OF PRESENT ILLNESS:  The patient is a 50 y.o. female  who I was asked to consult upon for anemia.   Labs showed a low hemoglobin of 8.8, but with an elevated MCV of 114.  Iron studies showed an elevated ferritin of 1118, a serum iron of 43, a TIBC of 344 and a mildly low iron saturation of 13%.  The patient denies having any overt forms of blood loss to explain her anemia.  She was hospitalized last month du to pneumonia and bilateral pulmonary emboli, from which she has nearly completely recovered.  Since her anemia was found, she has been taking an iron tablet every other day over the past few weeks.  She claims to have had a colonoscopy 5+ years ago, which showed polyps, but no other lower GI tract pathology.  Of note, her mother also has anemia.    PAST MEDICAL HISTORY:   Past Medical History:  Diagnosis Date  . Anxiety   . Arthritis    Back  . Back pain   . Depression   Hypercholesterolemia Hypertension   PAST SURGICAL HISTORY:   Past Surgical History:  Procedure Laterality Date  . ANTERIOR LAT LUMBAR FUSION N/A 03/15/2017   Procedure: ANTERIOR LATERAL LUMBAR INTERBODY FUSION LUMBAR FOUR- LUMBAR FIVE;  Surgeon: Lisbeth Renshaw, MD;  Location: [facility] OR;  Service: Neurosurgery;  Laterality: N/A;  . APPENDECTOMY    . APPLICATION OF ROBOTIC ASSISTANCE FOR SPINAL PROCEDURE N/A 03/15/2017   Procedure: APPLICATION OF ROBOTIC ASSISTANCE FOR SPINAL PROCEDURE;  Surgeon: Lisbeth Renshaw, MD;  Location: [facility] OR;  Service: Neurosurgery;  Laterality: N/A;  . CHOLECYSTECTOMY    . COLONOSCOPY    . DILATION AND CURETTAGE OF UTERUS     pre-cancerous cells removed from uterus  . LUMBAR PERCUTANEOUS PEDICLE SCREW 1 LEVEL N/A 03/15/2017   Procedure: ROBOTIC PERCUTANEOUS PEDICLE SCREW LUMBAR FOUR- LUMBAR FIVE;  Surgeon:  Lisbeth Renshaw, MD;  [facility];  Service: Neurosurgery;  Laterality: N/A;  . TUBAL LIGATION    . WISDOM TOOTH EXTRACTION     CURRENT MEDICATIONS:   Current Outpatient Medications  Medication Sig Dispense Refill  . acetaminophen (TYLENOL) 650 MG CR tablet Take 650 mg by mouth every 6 (six) hours as needed for pain.    . Aspirin-Caffeine (BC FAST PAIN RELIEF ARTHRITIS PO) Take 1 packet by mouth See admin instructions. ONE TO SIX TIMES A DAY AS NEEDED FOR PAIN    . carisoprodol (SOMA) 350 MG tablet Take 1 tablet (350 mg total) by mouth 3 (three) times daily. (Patient not taking: Reported on 07/28/2017) 30 tablet 0  . Diclofenac 18 MG CAPS Take 1 capsule by mouth 2 (two) times daily as needed. (Patient not taking: Reported on 07/28/2017) 90 capsule 0  . DULoxetine (CYMBALTA) 30 MG capsule Take 90 mg by mouth every morning.     . Gabapentin Enacarbil (HORIZANT) 600 MG TBCR Take 600 mg by mouth 2 (two) times daily.    . Gluc-Chonn-MSM-Boswellia-Vit D (GLUCOSAMINE CHOND TRIPLE/VIT D) TABS Take 2 tablets by mouth every morning.    . hydroxypropyl methylcellulose / hypromellose (ISOPTO TEARS / GONIOVISC) 2.5 % ophthalmic solution Place 1 drop into both eyes 4 (four) times daily as needed for dry eyes.    Marland Kitchen ibuprofen (ADVIL,MOTRIN) 200 MG tablet Take 800 mg by mouth every  6 (six) hours as needed (for pain).    . methylPREDNISolone (MEDROL) 4 MG TBPK tablet Take according to package insert 21 tablet 0  . naproxen sodium (ALEVE) 220 MG tablet Take 440 mg by mouth 2 (two) times daily as needed (for pain).    Marland Kitchen oxyCODONE (OXY IR/ROXICODONE) 5 MG immediate release tablet Take 1-2 tablets (5-10 mg total) by mouth every 4 (four) hours as needed for breakthrough pain. (Patient not taking: Reported on 07/28/2017) 50 tablet 0  . sertraline (ZOLOFT) 100 MG tablet Take 100 mg by mouth daily.     No current facility-administered medications for this visit.    ALLERGIES:  No Known Allergies  FAMILY HISTORY:   Her father has heart disease.  Her mother has anemia.  Her maternal grandmother and aunt both had breast cancer.    SOCIAL HISTORY:  The patient was born in [place].  She lives in town with her husband of 2 years.  She has 2 children and 1 grandchild.  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  She has smoked as much as 2-1/2 packs of cigarettes daily for the past 28 years.  She also drinks beer daily.  REVIEW OF SYSTEMS:  Review of Systems  Constitutional: Negative for fatigue and fever.  HENT:   Negative for hearing loss and sore throat.   Eyes: Negative for eye problems.  Respiratory: Positive for cough. Negative for chest tightness and hemoptysis.   Cardiovascular: Negative for chest pain and palpitations.  Gastrointestinal: Negative for abdominal distention, abdominal pain, blood in stool, constipation, diarrhea, nausea and vomiting.  Endocrine: Negative for hot flashes.  Genitourinary: Negative for difficulty urinating, dysuria, frequency, hematuria and nocturia.   Musculoskeletal: Positive for arthralgias (right hip). Negative for back pain, gait problem and myalgias.  Skin: Positive for wound (hidradenitis in her groin region). Negative for itching and rash.  Neurological: Positive for headaches. Negative for dizziness, extremity weakness, gait problem, light-headedness and numbness.  Hematological: Negative.   Psychiatric/Behavioral: Positive for depression. Negative for suicidal ideas. The patient is nervous/anxious.      PHYSICAL EXAM:  Blood pressure 140/78, pulse 93, temperature 98.8 F (37.1 C), resp. rate 14, height 5' 2.5" (1.588 m), weight (!) 308 lb 4.8 oz (139.8 kg), SpO2 99 %. Wt Readings from Last 3 Encounters:  03/18/21 (!) 308 lb 4.8 oz (139.8 kg)  07/28/17 265 lb (120.2 kg)  03/15/17 238 lb (108 kg)   Body mass index is 55.49 kg/m. Performance status (ECOG): 2 - Symptomatic, <50% confined to bed Physical Exam Constitutional:      Appearance: Normal  appearance. She is not ill-appearing.  HENT:     Mouth/Throat:     Mouth: Mucous membranes are moist.     Pharynx: Oropharynx is clear. No oropharyngeal exudate or posterior oropharyngeal erythema.  Cardiovascular:     Rate and Rhythm: Normal rate and regular rhythm.     Heart sounds: No murmur heard. No friction rub. No gallop.   Pulmonary:     Effort: Pulmonary effort is normal. No respiratory distress.     Breath sounds: Normal breath sounds. No wheezing, rhonchi or rales.  Chest:  Breasts:     Right: No axillary adenopathy or supraclavicular adenopathy.     Left: No axillary adenopathy or supraclavicular adenopathy.    Abdominal:     General: Bowel sounds are normal. There is no distension.     Palpations: Abdomen is soft. There is no mass.  Tenderness: There is no abdominal tenderness.  Musculoskeletal:        General: No swelling.     Right lower leg: No edema.     Left lower leg: No edema.  Lymphadenopathy:     Cervical: No cervical adenopathy.     Upper Body:     Right upper body: No supraclavicular or axillary adenopathy.     Left upper body: No supraclavicular or axillary adenopathy.     Lower Body: No right inguinal adenopathy. No left inguinal adenopathy.  Skin:    General: Skin is warm.     Coloration: Skin is not jaundiced.     Findings: No lesion or rash.  Neurological:     General: No focal deficit present.     Mental Status: She is alert and oriented to person, place, and time. Mental status is at baseline.     Cranial Nerves: Cranial nerves are intact.  Psychiatric:        Mood and Affect: Mood normal.        Behavior: Behavior normal.        Thought Content: Thought content normal.    LABS:    Her peripheral smear was pertinent for occasional teardrop red cells.  No other abnormal red cell morphology was seen.  ASSESSMENT & PLAN:  A 50 y.o. female who I was asked to consult upon for anemia.  However, her hemoglobin of 12.5 is normal today.   When evaluating her past labs, I am not convinced she had iron deficiency anemia, which usually causes microcytic indices.  She was very macrocytic, based upon her elevated MCV of 115.  Her MCV is still elevated today, making iron deficiency less likely.   Her iron, B12 and folate levels will be checked today.   LDH, haptoglobin and Coombs testing will be done to ensure some type of hemolytic anemia has not been going on.  A serum protein electrophoresis will also be checked to ensure a plasma cell dyscrasia is not present.  I am pleasantly surprised to see her hemoglobin completely normal today.  As that is the case, I will see her back in 2 months for repeat clinical assessment.  If her hemoglobin remains normal at that time, I will turn her care back over to her other physicians after her next visit.  The patient understands all the plans discussed today and is in agreement with them.  I do appreciate Wenda Low, NP,  for his new consult.   Margaretann Abate Kirby Funk, MD

## 2021-03-19 LAB — HAPTOGLOBIN: Haptoglobin: 131 mg/dL (ref 42–296)

## 2021-03-20 LAB — PROTEIN ELECTROPHORESIS, SERUM
A/G Ratio: 1 (ref 0.7–1.7)
Albumin ELP: 3.5 g/dL (ref 2.9–4.4)
Alpha-1-Globulin: 0.2 g/dL (ref 0.0–0.4)
Alpha-2-Globulin: 0.8 g/dL (ref 0.4–1.0)
Beta Globulin: 1.4 g/dL — ABNORMAL HIGH (ref 0.7–1.3)
Gamma Globulin: 0.9 g/dL (ref 0.4–1.8)
Globulin, Total: 3.4 g/dL (ref 2.2–3.9)
Total Protein ELP: 6.9 g/dL (ref 6.0–8.5)

## 2021-04-15 ENCOUNTER — Encounter: Payer: Self-pay | Admitting: Genetic Counselor

## 2021-05-14 NOTE — Progress Notes (Signed)
[facility]  [address] [place],  [place]  [address] [phone]  Clinic Day:  05/18/2021  Referring physician: Wenda Low, NP  This document serves as a record of services personally performed by Weston Settle, MD. It was created on their behalf by Curry,Lauren E, a trained medical scribe. The creation of this record is based on the scribe's personal observations and the provider's statements to them.  HISTORY OF PRESENT ILLNESS:  The patient is a 50 y.o. female  who I recently began seeing for  anemia.   However, labs at her initial visit showed a normal hemoglobin of 12.5.  The only labs that were abnormal were her reticulocyte count and her elevated LDH level, which suggested a mild degree of hemolysis was present.  She comes in today to reassess her hemoglobin.  Since her last visit, the patient has been doing well.  She denies having jaundice, tea-colored urine, or increase fatigue, which would be symptoms consistent with brisk hemolysis being present.  PHYSICAL EXAM:  Blood pressure 128/65, pulse 92, temperature 98.1 F (36.7 C), resp. rate 16, height 5' 2.5" (1.588 m), weight (!) 307 lb 12.8 oz (139.6 kg), SpO2 92 %. Wt Readings from Last 3 Encounters:  05/18/21 (!) 307 lb 12.8 oz (139.6 kg)  03/18/21 (!) 308 lb 4.8 oz (139.8 kg)  07/28/17 265 lb (120.2 kg)   Body mass index is 55.4 kg/m. Performance status (ECOG): 2 - Symptomatic, <50% confined to bed Physical Exam Constitutional:      Appearance: Normal appearance. She is not ill-appearing.  HENT:     Mouth/Throat:     Mouth: Mucous membranes are moist.     Pharynx: Oropharynx is clear. No oropharyngeal exudate or posterior oropharyngeal erythema.  Cardiovascular:     Rate and Rhythm: Normal rate and regular rhythm.     Heart sounds: No murmur heard.   No friction rub. No gallop.  Pulmonary:     Effort: Pulmonary effort is normal. No respiratory distress.     Breath sounds:  Normal breath sounds. No wheezing, rhonchi or rales.  Chest:  Breasts:    Right: No axillary adenopathy or supraclavicular adenopathy.     Left: No axillary adenopathy or supraclavicular adenopathy.  Abdominal:     General: Bowel sounds are normal. There is no distension.     Palpations: Abdomen is soft. There is no mass.     Tenderness: There is no abdominal tenderness.  Musculoskeletal:        General: No swelling.     Right lower leg: No edema.     Left lower leg: No edema.  Lymphadenopathy:     Cervical: No cervical adenopathy.     Upper Body:     Right upper body: No supraclavicular or axillary adenopathy.     Left upper body: No supraclavicular or axillary adenopathy.     Lower Body: No right inguinal adenopathy. No left inguinal adenopathy.  Skin:    General: Skin is warm.     Coloration: Skin is not jaundiced.     Findings: No lesion or rash.  Neurological:     General: No focal deficit present.     Mental Status: She is alert and oriented to person, place, and time. Mental status is at baseline.     Cranial Nerves: Cranial nerves are intact.  Psychiatric:        Mood and Affect: Mood normal.        Behavior: Behavior  normal.        Thought Content: Thought content normal.   LABS:    Ref. Range 05/18/2021 10:02  LDH Latest Ref Range: 98 - 192 U/L 166    Ref. Range 05/18/2021 10:02  RBC. Latest Ref Range: 3.87 - 5.11 MIL/uL 3.46 (L)  Retic Ct Pct Latest Ref Range: 0.4 - 3.1 % 6.8 (H)  Retic Count, Absolute Latest Ref Range: 19.0 - 186.0 K/uL 234.6 (H)  Immature Retic Fract Latest Ref Range: 2.3 - 15.9 % 30.8 (H)     Component Ref Range & Units 1 mo ago   Iron 28 - 170 ug/dL 69   TIBC 619 - 509 ug/dL 326   Saturation Ratios 10.4 - 31.8 % 16   UIBC ug/dL 712    Ferritin 11 - 458 ng/mL 94    Haptoglobin 42 - 296 mg/dL 099    Vitamin I-33 825 - 914 pg/mL 421    Folate >5.9 ng/mL 8.8    LDH 98 - 192 U/L 206 High     Component Ref Range & Units 1 mo ago    Retic Ct Pct 0.4 - 3.1 % 5.8 High    RBC. 3.87 - 5.11 MIL/uL 3.57 Low    Retic Count, Absolute 19.0 - 186.0 K/uL 206.0 High    Immature Retic Fract 2.3 - 15.9 % 30.9 High     Component Ref Range & Units 1 mo ago   Total Protein ELP 6.0 - 8.5 g/dL 6.9   Albumin ELP 2.9 - 4.4 g/dL 3.5   KNLZJ-6-BHALPFXT 0.0 - 0.4 g/dL 0.2   KWIOX-7-DZHGDJME 0.4 - 1.0 g/dL 0.8   Beta Globulin 0.7 - 1.3 g/dL 1.4 High    Gamma Globulin 0.4 - 1.8 g/dL 0.9   M-Spike, % Not Observed g/dL Not Observed    Globulin, Total 2.2 - 3.9 g/dL 3.4 VC   A/G Ratio 0.7 - 1.7 1.0 VC    Component 1 mo ago   DAT, complement NEG   DAT, IgG NEG    ASSESSMENT & PLAN:  A 50 y.o. female who I recently began seeing for anemia.  Once again, her hemoglobin is normal at 12.5.  I am also pleased as her haptoglobin is normal at 131.  Her LDH is also normal.  If there is any degree of hemolysis taking place, it is very minor, particularly as her hemoglobin is normal.  This signifies that her bone marrow is functioning adequately in producing the amount of red cells her body needs.   As she has no pressing hematologic issues, I do feel comfortable turning her care back over to her other physicians.  My recommendation is for her primary care office to check her CBC 2-3 times per year.  I would not have a problem seeing her in the future if new hematologic changes develop that require repeat clinical assessment.  The patient understands all the plans discussed today and is in agreement with them.   I, Foye Deer, am acting as scribe for Weston Settle, MD    I have reviewed this report as typed by the medical scribe, and it is complete and accurate.  Dequincy Kirby Funk, MD

## 2021-05-17 ENCOUNTER — Other Ambulatory Visit: Payer: Self-pay | Admitting: Oncology

## 2021-05-17 DIAGNOSIS — D539 Nutritional anemia, unspecified: Secondary | ICD-10-CM

## 2021-05-18 ENCOUNTER — Inpatient Hospital Stay: Payer: Self-pay

## 2021-05-18 ENCOUNTER — Encounter: Payer: Self-pay | Admitting: Oncology

## 2021-05-18 ENCOUNTER — Other Ambulatory Visit: Payer: Self-pay

## 2021-05-18 ENCOUNTER — Inpatient Hospital Stay: Payer: Medicaid Other | Attending: Oncology | Admitting: Oncology

## 2021-05-18 ENCOUNTER — Other Ambulatory Visit: Payer: Self-pay | Admitting: Oncology

## 2021-05-18 ENCOUNTER — Telehealth: Payer: Self-pay

## 2021-05-18 ENCOUNTER — Telehealth: Payer: Self-pay | Admitting: Oncology

## 2021-05-18 VITALS — BP 128/65 | HR 92 | Temp 98.1°F | Resp 16 | Ht 62.5 in | Wt 307.8 lb

## 2021-05-18 DIAGNOSIS — Z862 Personal history of diseases of the blood and blood-forming organs and certain disorders involving the immune mechanism: Secondary | ICD-10-CM

## 2021-05-18 DIAGNOSIS — D539 Nutritional anemia, unspecified: Secondary | ICD-10-CM

## 2021-05-18 DIAGNOSIS — D649 Anemia, unspecified: Secondary | ICD-10-CM | POA: Insufficient documentation

## 2021-05-18 LAB — LACTATE DEHYDROGENASE: LDH: 166 U/L (ref 98–192)

## 2021-05-18 LAB — RETICULOCYTES
Immature Retic Fract: 30.8 % — ABNORMAL HIGH (ref 2.3–15.9)
RBC.: 3.46 MIL/uL — ABNORMAL LOW (ref 3.87–5.11)
Retic Count, Absolute: 234.6 10*3/uL — ABNORMAL HIGH (ref 19.0–186.0)
Retic Ct Pct: 6.8 % — ABNORMAL HIGH (ref 0.4–3.1)

## 2021-05-18 LAB — CBC AND DIFFERENTIAL
HCT: 37 (ref 36–46)
Hemoglobin: 12.5 (ref 12.0–16.0)
Neutrophils Absolute: 4.87
Platelets: 358 (ref 150–399)
WBC: 8.7

## 2021-05-18 LAB — CBC: RBC: 3.5 — AB (ref 3.87–5.11)

## 2021-05-18 NOTE — Telephone Encounter (Addendum)
Dr Melvyn Neth states that her hemolysis labs are better. He plans to discharge her from clinic & recommends that her PCP check her CBC 2-3 times per year. Pt notified of above.    I asked Dr Melvyn Neth about pt's labs. He was tied up @ the moment, and will call me back.

## 2021-05-18 NOTE — Telephone Encounter (Signed)
Per 6/27 los next appt scheduled and given to patient 

## 2021-05-19 LAB — HAPTOGLOBIN: Haptoglobin: 131 mg/dL (ref 42–296)

## 2021-11-17 ENCOUNTER — Ambulatory Visit: Payer: Medicaid Other | Admitting: Oncology

## 2021-11-17 ENCOUNTER — Other Ambulatory Visit: Payer: Medicaid Other

## 2023-03-17 NOTE — Telephone Encounter (Signed)
Closed

## 2023-04-05 DIAGNOSIS — E559 Vitamin D deficiency, unspecified: Secondary | ICD-10-CM | POA: Diagnosis not present

## 2023-04-05 DIAGNOSIS — R739 Hyperglycemia, unspecified: Secondary | ICD-10-CM | POA: Diagnosis not present

## 2023-04-05 DIAGNOSIS — Z6841 Body Mass Index (BMI) 40.0 and over, adult: Secondary | ICD-10-CM | POA: Diagnosis not present

## 2023-04-05 DIAGNOSIS — E785 Hyperlipidemia, unspecified: Secondary | ICD-10-CM | POA: Diagnosis not present

## 2023-04-05 DIAGNOSIS — I10 Essential (primary) hypertension: Secondary | ICD-10-CM | POA: Diagnosis not present

## 2023-04-05 DIAGNOSIS — E039 Hypothyroidism, unspecified: Secondary | ICD-10-CM | POA: Diagnosis not present

## 2023-04-05 DIAGNOSIS — D509 Iron deficiency anemia, unspecified: Secondary | ICD-10-CM | POA: Diagnosis not present

## 2023-05-04 DIAGNOSIS — G4700 Insomnia, unspecified: Secondary | ICD-10-CM | POA: Diagnosis not present

## 2023-05-04 DIAGNOSIS — E785 Hyperlipidemia, unspecified: Secondary | ICD-10-CM | POA: Diagnosis not present

## 2023-05-04 DIAGNOSIS — F330 Major depressive disorder, recurrent, mild: Secondary | ICD-10-CM | POA: Diagnosis not present

## 2023-05-04 DIAGNOSIS — G8929 Other chronic pain: Secondary | ICD-10-CM | POA: Diagnosis not present

## 2023-05-04 DIAGNOSIS — F17210 Nicotine dependence, cigarettes, uncomplicated: Secondary | ICD-10-CM | POA: Diagnosis not present

## 2023-05-04 DIAGNOSIS — E038 Other specified hypothyroidism: Secondary | ICD-10-CM | POA: Diagnosis not present

## 2023-05-04 DIAGNOSIS — Z6841 Body Mass Index (BMI) 40.0 and over, adult: Secondary | ICD-10-CM | POA: Diagnosis not present

## 2023-05-04 DIAGNOSIS — G63 Polyneuropathy in diseases classified elsewhere: Secondary | ICD-10-CM | POA: Diagnosis not present

## 2023-05-04 DIAGNOSIS — E559 Vitamin D deficiency, unspecified: Secondary | ICD-10-CM | POA: Diagnosis not present

## 2023-05-04 DIAGNOSIS — E039 Hypothyroidism, unspecified: Secondary | ICD-10-CM | POA: Diagnosis not present

## 2023-05-04 DIAGNOSIS — E1169 Type 2 diabetes mellitus with other specified complication: Secondary | ICD-10-CM | POA: Diagnosis not present

## 2023-05-25 DIAGNOSIS — Z720 Tobacco use: Secondary | ICD-10-CM | POA: Diagnosis not present

## 2023-05-25 DIAGNOSIS — F1190 Opioid use, unspecified, uncomplicated: Secondary | ICD-10-CM | POA: Diagnosis not present

## 2023-05-25 DIAGNOSIS — Z5181 Encounter for therapeutic drug level monitoring: Secondary | ICD-10-CM | POA: Diagnosis not present

## 2023-05-25 DIAGNOSIS — M25551 Pain in right hip: Secondary | ICD-10-CM | POA: Diagnosis not present

## 2023-05-25 DIAGNOSIS — Z79891 Long term (current) use of opiate analgesic: Secondary | ICD-10-CM | POA: Diagnosis not present

## 2023-05-25 DIAGNOSIS — G8929 Other chronic pain: Secondary | ICD-10-CM | POA: Diagnosis not present

## 2023-05-25 DIAGNOSIS — G4700 Insomnia, unspecified: Secondary | ICD-10-CM | POA: Diagnosis not present

## 2023-05-25 DIAGNOSIS — M5416 Radiculopathy, lumbar region: Secondary | ICD-10-CM | POA: Diagnosis not present

## 2023-05-25 DIAGNOSIS — Z6841 Body Mass Index (BMI) 40.0 and over, adult: Secondary | ICD-10-CM | POA: Diagnosis not present

## 2023-05-25 DIAGNOSIS — M961 Postlaminectomy syndrome, not elsewhere classified: Secondary | ICD-10-CM | POA: Diagnosis not present

## 2023-08-08 DIAGNOSIS — F338 Other recurrent depressive disorders: Secondary | ICD-10-CM | POA: Diagnosis not present

## 2023-08-08 DIAGNOSIS — I10 Essential (primary) hypertension: Secondary | ICD-10-CM | POA: Diagnosis not present

## 2023-08-08 DIAGNOSIS — E119 Type 2 diabetes mellitus without complications: Secondary | ICD-10-CM | POA: Diagnosis not present

## 2023-08-08 DIAGNOSIS — D509 Iron deficiency anemia, unspecified: Secondary | ICD-10-CM | POA: Diagnosis not present

## 2023-08-08 DIAGNOSIS — E559 Vitamin D deficiency, unspecified: Secondary | ICD-10-CM | POA: Diagnosis not present

## 2023-08-08 DIAGNOSIS — E785 Hyperlipidemia, unspecified: Secondary | ICD-10-CM | POA: Diagnosis not present

## 2023-08-08 DIAGNOSIS — E039 Hypothyroidism, unspecified: Secondary | ICD-10-CM | POA: Diagnosis not present

## 2023-08-24 DIAGNOSIS — G8929 Other chronic pain: Secondary | ICD-10-CM | POA: Diagnosis not present

## 2023-08-24 DIAGNOSIS — Z720 Tobacco use: Secondary | ICD-10-CM | POA: Diagnosis not present

## 2023-08-24 DIAGNOSIS — M5416 Radiculopathy, lumbar region: Secondary | ICD-10-CM | POA: Diagnosis not present

## 2023-08-24 DIAGNOSIS — Z6841 Body Mass Index (BMI) 40.0 and over, adult: Secondary | ICD-10-CM | POA: Diagnosis not present

## 2023-08-24 DIAGNOSIS — R030 Elevated blood-pressure reading, without diagnosis of hypertension: Secondary | ICD-10-CM | POA: Diagnosis not present

## 2023-08-24 DIAGNOSIS — M961 Postlaminectomy syndrome, not elsewhere classified: Secondary | ICD-10-CM | POA: Diagnosis not present

## 2023-08-24 DIAGNOSIS — F1190 Opioid use, unspecified, uncomplicated: Secondary | ICD-10-CM | POA: Diagnosis not present

## 2023-08-24 DIAGNOSIS — G4700 Insomnia, unspecified: Secondary | ICD-10-CM | POA: Diagnosis not present

## 2023-08-24 DIAGNOSIS — M25551 Pain in right hip: Secondary | ICD-10-CM | POA: Diagnosis not present

## 2023-08-24 DIAGNOSIS — Z79891 Long term (current) use of opiate analgesic: Secondary | ICD-10-CM | POA: Diagnosis not present

## 2023-08-24 DIAGNOSIS — E66813 Obesity, class 3: Secondary | ICD-10-CM | POA: Diagnosis not present

## 2023-09-08 DIAGNOSIS — D509 Iron deficiency anemia, unspecified: Secondary | ICD-10-CM | POA: Diagnosis not present

## 2023-09-13 DIAGNOSIS — J189 Pneumonia, unspecified organism: Secondary | ICD-10-CM | POA: Diagnosis not present

## 2023-11-24 DIAGNOSIS — G8929 Other chronic pain: Secondary | ICD-10-CM | POA: Diagnosis not present

## 2023-11-24 DIAGNOSIS — M5416 Radiculopathy, lumbar region: Secondary | ICD-10-CM | POA: Diagnosis not present

## 2023-11-24 DIAGNOSIS — G4700 Insomnia, unspecified: Secondary | ICD-10-CM | POA: Diagnosis not present

## 2023-11-24 DIAGNOSIS — Z6841 Body Mass Index (BMI) 40.0 and over, adult: Secondary | ICD-10-CM | POA: Diagnosis not present

## 2023-11-24 DIAGNOSIS — F1190 Opioid use, unspecified, uncomplicated: Secondary | ICD-10-CM | POA: Diagnosis not present

## 2023-11-24 DIAGNOSIS — Z79891 Long term (current) use of opiate analgesic: Secondary | ICD-10-CM | POA: Diagnosis not present

## 2023-11-24 DIAGNOSIS — E66813 Obesity, class 3: Secondary | ICD-10-CM | POA: Diagnosis not present

## 2023-11-24 DIAGNOSIS — M25551 Pain in right hip: Secondary | ICD-10-CM | POA: Diagnosis not present

## 2023-11-24 DIAGNOSIS — M961 Postlaminectomy syndrome, not elsewhere classified: Secondary | ICD-10-CM | POA: Diagnosis not present

## 2023-11-24 DIAGNOSIS — Z720 Tobacco use: Secondary | ICD-10-CM | POA: Diagnosis not present

## 2023-12-12 DIAGNOSIS — F338 Other recurrent depressive disorders: Secondary | ICD-10-CM | POA: Diagnosis not present

## 2023-12-12 DIAGNOSIS — I10 Essential (primary) hypertension: Secondary | ICD-10-CM | POA: Diagnosis not present

## 2023-12-12 DIAGNOSIS — F419 Anxiety disorder, unspecified: Secondary | ICD-10-CM | POA: Diagnosis not present

## 2023-12-12 DIAGNOSIS — E119 Type 2 diabetes mellitus without complications: Secondary | ICD-10-CM | POA: Diagnosis not present

## 2023-12-12 DIAGNOSIS — M1611 Unilateral primary osteoarthritis, right hip: Secondary | ICD-10-CM | POA: Diagnosis not present

## 2023-12-12 DIAGNOSIS — E039 Hypothyroidism, unspecified: Secondary | ICD-10-CM | POA: Diagnosis not present

## 2023-12-12 DIAGNOSIS — E785 Hyperlipidemia, unspecified: Secondary | ICD-10-CM | POA: Diagnosis not present

## 2023-12-20 DIAGNOSIS — L988 Other specified disorders of the skin and subcutaneous tissue: Secondary | ICD-10-CM | POA: Diagnosis not present

## 2023-12-20 DIAGNOSIS — Z1231 Encounter for screening mammogram for malignant neoplasm of breast: Secondary | ICD-10-CM | POA: Diagnosis not present

## 2023-12-22 DIAGNOSIS — Z803 Family history of malignant neoplasm of breast: Secondary | ICD-10-CM | POA: Diagnosis not present

## 2023-12-22 DIAGNOSIS — Z1231 Encounter for screening mammogram for malignant neoplasm of breast: Secondary | ICD-10-CM | POA: Diagnosis not present

## 2024-01-03 DIAGNOSIS — L732 Hidradenitis suppurativa: Secondary | ICD-10-CM | POA: Diagnosis not present

## 2024-01-03 DIAGNOSIS — Z79899 Other long term (current) drug therapy: Secondary | ICD-10-CM | POA: Diagnosis not present

## 2024-01-03 DIAGNOSIS — L820 Inflamed seborrheic keratosis: Secondary | ICD-10-CM | POA: Diagnosis not present

## 2024-01-17 DIAGNOSIS — M25551 Pain in right hip: Secondary | ICD-10-CM | POA: Diagnosis not present

## 2024-01-24 DIAGNOSIS — M25551 Pain in right hip: Secondary | ICD-10-CM | POA: Diagnosis not present

## 2024-02-22 DIAGNOSIS — M47812 Spondylosis without myelopathy or radiculopathy, cervical region: Secondary | ICD-10-CM | POA: Diagnosis not present

## 2024-02-22 DIAGNOSIS — M961 Postlaminectomy syndrome, not elsewhere classified: Secondary | ICD-10-CM | POA: Diagnosis not present

## 2024-02-22 DIAGNOSIS — M79603 Pain in arm, unspecified: Secondary | ICD-10-CM | POA: Diagnosis not present

## 2024-02-22 DIAGNOSIS — M25551 Pain in right hip: Secondary | ICD-10-CM | POA: Diagnosis not present

## 2024-02-22 DIAGNOSIS — G8929 Other chronic pain: Secondary | ICD-10-CM | POA: Diagnosis not present

## 2024-02-22 DIAGNOSIS — Z6841 Body Mass Index (BMI) 40.0 and over, adult: Secondary | ICD-10-CM | POA: Diagnosis not present

## 2024-02-22 DIAGNOSIS — Z720 Tobacco use: Secondary | ICD-10-CM | POA: Diagnosis not present

## 2024-02-22 DIAGNOSIS — F1190 Opioid use, unspecified, uncomplicated: Secondary | ICD-10-CM | POA: Diagnosis not present

## 2024-02-22 DIAGNOSIS — Z5181 Encounter for therapeutic drug level monitoring: Secondary | ICD-10-CM | POA: Diagnosis not present

## 2024-02-22 DIAGNOSIS — E66813 Obesity, class 3: Secondary | ICD-10-CM | POA: Diagnosis not present

## 2024-02-22 DIAGNOSIS — M47811 Spondylosis without myelopathy or radiculopathy, occipito-atlanto-axial region: Secondary | ICD-10-CM | POA: Diagnosis not present

## 2024-02-22 DIAGNOSIS — M5416 Radiculopathy, lumbar region: Secondary | ICD-10-CM | POA: Diagnosis not present

## 2024-02-22 DIAGNOSIS — M542 Cervicalgia: Secondary | ICD-10-CM | POA: Diagnosis not present

## 2024-02-22 DIAGNOSIS — G4700 Insomnia, unspecified: Secondary | ICD-10-CM | POA: Diagnosis not present

## 2024-02-22 DIAGNOSIS — Z79891 Long term (current) use of opiate analgesic: Secondary | ICD-10-CM | POA: Diagnosis not present

## 2024-03-14 DIAGNOSIS — M1611 Unilateral primary osteoarthritis, right hip: Secondary | ICD-10-CM | POA: Diagnosis not present

## 2024-03-14 DIAGNOSIS — L608 Other nail disorders: Secondary | ICD-10-CM | POA: Diagnosis not present

## 2024-03-14 DIAGNOSIS — Z7409 Other reduced mobility: Secondary | ICD-10-CM | POA: Diagnosis not present

## 2024-03-14 DIAGNOSIS — E119 Type 2 diabetes mellitus without complications: Secondary | ICD-10-CM | POA: Diagnosis not present

## 2024-03-14 DIAGNOSIS — Z6841 Body Mass Index (BMI) 40.0 and over, adult: Secondary | ICD-10-CM | POA: Diagnosis not present

## 2024-03-14 DIAGNOSIS — F338 Other recurrent depressive disorders: Secondary | ICD-10-CM | POA: Diagnosis not present

## 2024-03-14 DIAGNOSIS — E559 Vitamin D deficiency, unspecified: Secondary | ICD-10-CM | POA: Diagnosis not present

## 2024-03-14 DIAGNOSIS — I10 Essential (primary) hypertension: Secondary | ICD-10-CM | POA: Diagnosis not present

## 2024-03-14 DIAGNOSIS — E785 Hyperlipidemia, unspecified: Secondary | ICD-10-CM | POA: Diagnosis not present

## 2024-03-14 DIAGNOSIS — Z79899 Other long term (current) drug therapy: Secondary | ICD-10-CM | POA: Diagnosis not present

## 2024-03-14 DIAGNOSIS — E039 Hypothyroidism, unspecified: Secondary | ICD-10-CM | POA: Diagnosis not present

## 2024-03-20 DIAGNOSIS — I2510 Atherosclerotic heart disease of native coronary artery without angina pectoris: Secondary | ICD-10-CM | POA: Diagnosis not present

## 2024-03-20 DIAGNOSIS — Z122 Encounter for screening for malignant neoplasm of respiratory organs: Secondary | ICD-10-CM | POA: Diagnosis not present

## 2024-03-20 DIAGNOSIS — F17210 Nicotine dependence, cigarettes, uncomplicated: Secondary | ICD-10-CM | POA: Diagnosis not present

## 2024-03-20 DIAGNOSIS — M542 Cervicalgia: Secondary | ICD-10-CM | POA: Diagnosis not present

## 2024-03-21 ENCOUNTER — Ambulatory Visit: Admitting: Podiatry

## 2024-03-21 DIAGNOSIS — B351 Tinea unguium: Secondary | ICD-10-CM

## 2024-03-21 DIAGNOSIS — E1142 Type 2 diabetes mellitus with diabetic polyneuropathy: Secondary | ICD-10-CM | POA: Diagnosis not present

## 2024-03-21 DIAGNOSIS — M79674 Pain in right toe(s): Secondary | ICD-10-CM

## 2024-03-21 DIAGNOSIS — M79675 Pain in left toe(s): Secondary | ICD-10-CM

## 2024-03-21 NOTE — Progress Notes (Signed)
 Subjective:  Patient ID: Kathleen Hansen, female    DOB: [birth year],  MRN: [id number]   Kathleen Hansen presents to clinic today for:  Chief Complaint  Patient presents with   Diabetes    NP here for Diabetic exam and nail care. Last A1c is unknown, Not monitored on reg basis. Takes ASA 81   Patient notes nails are thick, discolored, elongated and painful in shoegear .  Patient is restricted to a power wheelchair today, and noted she occasionally will use a walker at home.  She has had previous lower back procedures/fusion as well as right hip arthritis.  She was referred here for diabetic footcare.  She is interested in the diabetic shoe program  PCP is Potts, Lindsay Rho, NP.  Past Medical History:  Diagnosis Date   Anxiety    Arthritis    Back   Back pain    Depression     Past Surgical History:  Procedure Laterality Date   ANTERIOR LAT LUMBAR FUSION N/A 03/15/2017   Procedure: ANTERIOR LATERAL LUMBAR INTERBODY FUSION LUMBAR FOUR- LUMBAR FIVE;  Surgeon: Augusto Blonder, MD;  [facility];  Service: Neurosurgery;  Laterality: N/A;   APPENDECTOMY     APPLICATION OF ROBOTIC ASSISTANCE FOR SPINAL PROCEDURE N/A 03/15/2017   Procedure: APPLICATION OF ROBOTIC ASSISTANCE FOR SPINAL PROCEDURE;  Surgeon: Augusto Blonder, MD;  [facility]: [facility] OR;  Service: Neurosurgery;  Laterality: N/A;   CHOLECYSTECTOMY     COLONOSCOPY     DILATION AND CURETTAGE OF UTERUS     pre-cancerous cells removed from uterus   LUMBAR PERCUTANEOUS PEDICLE SCREW 1 LEVEL N/A 03/15/2017   Procedure: ROBOTIC PERCUTANEOUS PEDICLE SCREW LUMBAR FOUR- LUMBAR FIVE;  Surgeon: Augusto Blonder, MD;  [facility];  Service: Neurosurgery;  Laterality: N/A;   TUBAL LIGATION     WISDOM TOOTH EXTRACTION      No Known Allergies  Review of Systems: Negative except as noted in the HPI.  Objective:  Kathleen Hansen is a pleasant 53 y.o. female in NAD. AAO x 3.  Vascular Examination: Capillary refill time is 3-5  seconds to toes bilateral. Palpable pedal pulses b/l LE. Digital hair present b/l.  Skin temperature gradient WNL b/l.   Dermatological Examination: Pedal skin with normal turgor, texture and tone b/l. No open wounds. No interdigital macerations b/l. Toenails x10 are 3mm thick, discolored, dystrophic with subungual debris. There is pain with compression of the nail plates.  They are elongated x10  Neurological Examination: Protective sensation intact bilateral LE. Vibratory sensation diminished bilateral forefoot.  Assessment/Plan: 1. Pain due to onychomycosis of toenails of both feet   2. Type 2 diabetes mellitus with peripheral neuropathy (HCC)    The mycotic toenails were sharply debrided x10 with sterile nail nippers and a power debriding burr to decrease bulk/thickness and length.    Patient is a good candidate for diabetic shoes due to her neuropathy and difficulty ambulating secondary to lower back issues.  She would benefit from a good stable, cushioned shoe.  I will refer her to [facility] for diabetic shoes with custom diabetic inserts  Return in about 3 months (around 06/20/2024) for [facility].   Joe Murders, DPM, FACFAS [facility]     [address]  [place], [place] [address]                Office [phone]  Fax [phone]

## 2024-03-22 DIAGNOSIS — M25559 Pain in unspecified hip: Secondary | ICD-10-CM | POA: Diagnosis not present

## 2024-03-22 DIAGNOSIS — E669 Obesity, unspecified: Secondary | ICD-10-CM | POA: Diagnosis not present

## 2024-03-22 DIAGNOSIS — Z6841 Body Mass Index (BMI) 40.0 and over, adult: Secondary | ICD-10-CM | POA: Diagnosis not present

## 2024-03-22 DIAGNOSIS — G8929 Other chronic pain: Secondary | ICD-10-CM | POA: Diagnosis not present

## 2024-03-22 DIAGNOSIS — G4700 Insomnia, unspecified: Secondary | ICD-10-CM | POA: Diagnosis not present

## 2024-03-22 DIAGNOSIS — B0229 Other postherpetic nervous system involvement: Secondary | ICD-10-CM | POA: Diagnosis not present

## 2024-03-22 DIAGNOSIS — I10 Essential (primary) hypertension: Secondary | ICD-10-CM | POA: Diagnosis not present

## 2024-03-22 DIAGNOSIS — R21 Rash and other nonspecific skin eruption: Secondary | ICD-10-CM | POA: Diagnosis not present

## 2024-03-22 DIAGNOSIS — B028 Zoster with other complications: Secondary | ICD-10-CM | POA: Diagnosis not present

## 2024-03-22 DIAGNOSIS — Z86711 Personal history of pulmonary embolism: Secondary | ICD-10-CM | POA: Diagnosis not present

## 2024-03-22 DIAGNOSIS — Z79890 Hormone replacement therapy (postmenopausal): Secondary | ICD-10-CM | POA: Diagnosis not present

## 2024-03-22 DIAGNOSIS — E785 Hyperlipidemia, unspecified: Secondary | ICD-10-CM | POA: Diagnosis not present

## 2024-03-22 DIAGNOSIS — F17210 Nicotine dependence, cigarettes, uncomplicated: Secondary | ICD-10-CM | POA: Diagnosis not present

## 2024-03-22 DIAGNOSIS — L732 Hidradenitis suppurativa: Secondary | ICD-10-CM | POA: Diagnosis not present

## 2024-03-22 DIAGNOSIS — E039 Hypothyroidism, unspecified: Secondary | ICD-10-CM | POA: Diagnosis not present

## 2024-03-22 DIAGNOSIS — Z7984 Long term (current) use of oral hypoglycemic drugs: Secondary | ICD-10-CM | POA: Diagnosis not present

## 2024-03-22 DIAGNOSIS — E119 Type 2 diabetes mellitus without complications: Secondary | ICD-10-CM | POA: Diagnosis not present

## 2024-03-22 DIAGNOSIS — Z7985 Long-term (current) use of injectable non-insulin antidiabetic drugs: Secondary | ICD-10-CM | POA: Diagnosis not present

## 2024-03-22 DIAGNOSIS — M549 Dorsalgia, unspecified: Secondary | ICD-10-CM | POA: Diagnosis not present

## 2024-03-23 DIAGNOSIS — B028 Zoster with other complications: Secondary | ICD-10-CM | POA: Diagnosis not present

## 2024-03-24 DIAGNOSIS — G4700 Insomnia, unspecified: Secondary | ICD-10-CM | POA: Diagnosis not present

## 2024-03-24 DIAGNOSIS — F17210 Nicotine dependence, cigarettes, uncomplicated: Secondary | ICD-10-CM | POA: Diagnosis not present

## 2024-03-24 DIAGNOSIS — E039 Hypothyroidism, unspecified: Secondary | ICD-10-CM | POA: Diagnosis not present

## 2024-03-24 DIAGNOSIS — Z6841 Body Mass Index (BMI) 40.0 and over, adult: Secondary | ICD-10-CM | POA: Diagnosis not present

## 2024-03-24 DIAGNOSIS — M25559 Pain in unspecified hip: Secondary | ICD-10-CM | POA: Diagnosis not present

## 2024-03-24 DIAGNOSIS — M549 Dorsalgia, unspecified: Secondary | ICD-10-CM | POA: Diagnosis not present

## 2024-03-24 DIAGNOSIS — Z79890 Hormone replacement therapy (postmenopausal): Secondary | ICD-10-CM | POA: Diagnosis not present

## 2024-03-24 DIAGNOSIS — Z7985 Long-term (current) use of injectable non-insulin antidiabetic drugs: Secondary | ICD-10-CM | POA: Diagnosis not present

## 2024-03-24 DIAGNOSIS — B028 Zoster with other complications: Secondary | ICD-10-CM | POA: Diagnosis not present

## 2024-03-24 DIAGNOSIS — E119 Type 2 diabetes mellitus without complications: Secondary | ICD-10-CM | POA: Diagnosis not present

## 2024-03-24 DIAGNOSIS — Z7984 Long term (current) use of oral hypoglycemic drugs: Secondary | ICD-10-CM | POA: Diagnosis not present

## 2024-03-24 DIAGNOSIS — E785 Hyperlipidemia, unspecified: Secondary | ICD-10-CM | POA: Diagnosis not present

## 2024-03-24 DIAGNOSIS — L732 Hidradenitis suppurativa: Secondary | ICD-10-CM | POA: Diagnosis not present

## 2024-03-24 DIAGNOSIS — E669 Obesity, unspecified: Secondary | ICD-10-CM | POA: Diagnosis not present

## 2024-03-24 DIAGNOSIS — G8929 Other chronic pain: Secondary | ICD-10-CM | POA: Diagnosis not present

## 2024-03-24 DIAGNOSIS — B0229 Other postherpetic nervous system involvement: Secondary | ICD-10-CM | POA: Diagnosis not present

## 2024-03-24 DIAGNOSIS — Z86711 Personal history of pulmonary embolism: Secondary | ICD-10-CM | POA: Diagnosis not present

## 2024-03-24 DIAGNOSIS — I10 Essential (primary) hypertension: Secondary | ICD-10-CM | POA: Diagnosis not present

## 2024-03-26 DIAGNOSIS — B0230 Zoster ocular disease, unspecified: Secondary | ICD-10-CM | POA: Diagnosis not present

## 2024-03-28 DIAGNOSIS — Z09 Encounter for follow-up examination after completed treatment for conditions other than malignant neoplasm: Secondary | ICD-10-CM | POA: Diagnosis not present

## 2024-03-28 DIAGNOSIS — Z79899 Other long term (current) drug therapy: Secondary | ICD-10-CM | POA: Diagnosis not present

## 2024-03-28 DIAGNOSIS — B029 Zoster without complications: Secondary | ICD-10-CM | POA: Diagnosis not present

## 2024-03-28 DIAGNOSIS — E876 Hypokalemia: Secondary | ICD-10-CM | POA: Diagnosis not present

## 2024-04-03 DIAGNOSIS — Z720 Tobacco use: Secondary | ICD-10-CM | POA: Diagnosis not present

## 2024-04-03 DIAGNOSIS — M542 Cervicalgia: Secondary | ICD-10-CM | POA: Diagnosis not present

## 2024-04-03 DIAGNOSIS — Z6841 Body Mass Index (BMI) 40.0 and over, adult: Secondary | ICD-10-CM | POA: Diagnosis not present

## 2024-04-03 DIAGNOSIS — F1190 Opioid use, unspecified, uncomplicated: Secondary | ICD-10-CM | POA: Diagnosis not present

## 2024-04-03 DIAGNOSIS — R030 Elevated blood-pressure reading, without diagnosis of hypertension: Secondary | ICD-10-CM | POA: Diagnosis not present

## 2024-04-03 DIAGNOSIS — Z79891 Long term (current) use of opiate analgesic: Secondary | ICD-10-CM | POA: Diagnosis not present

## 2024-04-03 DIAGNOSIS — G4700 Insomnia, unspecified: Secondary | ICD-10-CM | POA: Diagnosis not present

## 2024-04-03 DIAGNOSIS — M5416 Radiculopathy, lumbar region: Secondary | ICD-10-CM | POA: Diagnosis not present

## 2024-04-03 DIAGNOSIS — M961 Postlaminectomy syndrome, not elsewhere classified: Secondary | ICD-10-CM | POA: Diagnosis not present

## 2024-04-03 DIAGNOSIS — E66813 Obesity, class 3: Secondary | ICD-10-CM | POA: Diagnosis not present

## 2024-04-11 DIAGNOSIS — M79602 Pain in left arm: Secondary | ICD-10-CM | POA: Diagnosis not present

## 2024-04-11 DIAGNOSIS — M25611 Stiffness of right shoulder, not elsewhere classified: Secondary | ICD-10-CM | POA: Diagnosis not present

## 2024-04-11 DIAGNOSIS — M6281 Muscle weakness (generalized): Secondary | ICD-10-CM | POA: Diagnosis not present

## 2024-04-11 DIAGNOSIS — M542 Cervicalgia: Secondary | ICD-10-CM | POA: Diagnosis not present

## 2024-04-11 DIAGNOSIS — M5489 Other dorsalgia: Secondary | ICD-10-CM | POA: Diagnosis not present

## 2024-04-11 DIAGNOSIS — R293 Abnormal posture: Secondary | ICD-10-CM | POA: Diagnosis not present

## 2024-04-11 DIAGNOSIS — M25612 Stiffness of left shoulder, not elsewhere classified: Secondary | ICD-10-CM | POA: Diagnosis not present

## 2024-04-11 DIAGNOSIS — M79601 Pain in right arm: Secondary | ICD-10-CM | POA: Diagnosis not present

## 2024-04-11 DIAGNOSIS — M2560 Stiffness of unspecified joint, not elsewhere classified: Secondary | ICD-10-CM | POA: Diagnosis not present

## 2024-04-11 DIAGNOSIS — R208 Other disturbances of skin sensation: Secondary | ICD-10-CM | POA: Diagnosis not present

## 2024-04-30 DIAGNOSIS — M5489 Other dorsalgia: Secondary | ICD-10-CM | POA: Diagnosis not present

## 2024-04-30 DIAGNOSIS — M542 Cervicalgia: Secondary | ICD-10-CM | POA: Diagnosis not present

## 2024-04-30 DIAGNOSIS — M79601 Pain in right arm: Secondary | ICD-10-CM | POA: Diagnosis not present

## 2024-04-30 DIAGNOSIS — M6281 Muscle weakness (generalized): Secondary | ICD-10-CM | POA: Diagnosis not present

## 2024-04-30 DIAGNOSIS — M25612 Stiffness of left shoulder, not elsewhere classified: Secondary | ICD-10-CM | POA: Diagnosis not present

## 2024-04-30 DIAGNOSIS — R293 Abnormal posture: Secondary | ICD-10-CM | POA: Diagnosis not present

## 2024-04-30 DIAGNOSIS — R208 Other disturbances of skin sensation: Secondary | ICD-10-CM | POA: Diagnosis not present

## 2024-04-30 DIAGNOSIS — M79602 Pain in left arm: Secondary | ICD-10-CM | POA: Diagnosis not present

## 2024-04-30 DIAGNOSIS — M25611 Stiffness of right shoulder, not elsewhere classified: Secondary | ICD-10-CM | POA: Diagnosis not present

## 2024-04-30 DIAGNOSIS — M2560 Stiffness of unspecified joint, not elsewhere classified: Secondary | ICD-10-CM | POA: Diagnosis not present

## 2024-05-03 DIAGNOSIS — E119 Type 2 diabetes mellitus without complications: Secondary | ICD-10-CM | POA: Diagnosis not present

## 2024-05-30 DIAGNOSIS — H40013 Open angle with borderline findings, low risk, bilateral: Secondary | ICD-10-CM | POA: Diagnosis not present

## 2024-06-19 DIAGNOSIS — I10 Essential (primary) hypertension: Secondary | ICD-10-CM | POA: Diagnosis not present

## 2024-06-19 DIAGNOSIS — E785 Hyperlipidemia, unspecified: Secondary | ICD-10-CM | POA: Diagnosis not present

## 2024-06-19 DIAGNOSIS — F338 Other recurrent depressive disorders: Secondary | ICD-10-CM | POA: Diagnosis not present

## 2024-06-19 DIAGNOSIS — E039 Hypothyroidism, unspecified: Secondary | ICD-10-CM | POA: Diagnosis not present

## 2024-06-19 DIAGNOSIS — M1611 Unilateral primary osteoarthritis, right hip: Secondary | ICD-10-CM | POA: Diagnosis not present

## 2024-06-19 DIAGNOSIS — E119 Type 2 diabetes mellitus without complications: Secondary | ICD-10-CM | POA: Diagnosis not present

## 2024-06-20 ENCOUNTER — Ambulatory Visit: Admitting: Podiatry

## 2024-06-20 DIAGNOSIS — M79675 Pain in left toe(s): Secondary | ICD-10-CM | POA: Diagnosis not present

## 2024-06-20 DIAGNOSIS — M79674 Pain in right toe(s): Secondary | ICD-10-CM

## 2024-06-20 DIAGNOSIS — B351 Tinea unguium: Secondary | ICD-10-CM

## 2024-06-20 DIAGNOSIS — E1142 Type 2 diabetes mellitus with diabetic polyneuropathy: Secondary | ICD-10-CM

## 2024-06-20 NOTE — Progress Notes (Unsigned)
    Subjective:  Patient ID: Kathleen Hansen, female    DOB: 1971-08-27,  MRN: [id number]  Kathleen Hansen presents to clinic today for:  Chief Complaint  Patient presents with   [facility]    [facility] with out callous. A1c was done yesterday, she does not have the results yet. Last one in May was 5.2 and takes ASA.   Patient notes nails are thick, discolored, elongated and painful in shoegear when trying to ambulate.  There is a small pinch callus on the right hallux.  She is in a power wheelchair today.   PCP is Potts, Therisa RAMAN, NP.  Past Medical History:  Diagnosis Date   Anxiety    Arthritis    Back   Back pain    Depression    Past Surgical History:  Procedure Laterality Date   ANTERIOR LAT LUMBAR FUSION N/A 03/15/2017   Procedure: ANTERIOR LATERAL LUMBAR INTERBODY FUSION LUMBAR FOUR- LUMBAR FIVE;  Surgeon: Lanis Pupa, MD;  [facility];  Service: Neurosurgery;  Laterality: N/A;   APPENDECTOMY     APPLICATION OF ROBOTIC ASSISTANCE FOR SPINAL PROCEDURE N/A 03/15/2017   Procedure: APPLICATION OF ROBOTIC ASSISTANCE FOR SPINAL PROCEDURE;  Surgeon: Lanis Pupa, MD;  [facility];  Service: Neurosurgery;  Laterality: N/A;   CHOLECYSTECTOMY     COLONOSCOPY     DILATION AND CURETTAGE OF UTERUS     pre-cancerous cells removed from uterus   LUMBAR PERCUTANEOUS PEDICLE SCREW 1 LEVEL N/A 03/15/2017   Procedure: ROBOTIC PERCUTANEOUS PEDICLE SCREW LUMBAR FOUR- LUMBAR FIVE;  Surgeon: Lanis Pupa, MD;  [facility];  Service: Neurosurgery;  Laterality: N/A;   TUBAL LIGATION     WISDOM TOOTH EXTRACTION     No Known Allergies  Review of Systems: Negative except as noted in the HPI.  Objective:  Kathleen Hansen is a pleasant 53 y.o. female in NAD. AAO x 3.  Vascular Examination: Capillary refill time is 3-5 seconds to toes bilateral. Palpable pedal pulses b/l LE. Digital hair present b/l.  Skin temperature gradient WNL b/l. No varicosities b/l. No cyanosis noted b/l.    Dermatological Examination: Pedal skin with normal turgor, texture and tone b/l. No open wounds. No interdigital macerations b/l. Toenails x10 are 3mm thick, discolored, dystrophic with subungual debris. There is pain with compression of the nail plates.  They are elongated x10.  Small hyperkeratotic lesion on the plantar medial aspect of the right hallux IPJ  Assessment/Plan: 1. Pain due to onychomycosis of toenails of both feet   2. Type 2 diabetes mellitus with peripheral neuropathy (HCC)    The mycotic toenails were sharply debrided x10 with sterile nail nippers and a power debriding burr to decrease bulk/thickness and length.  The pinch callus was shaved with sterile #313 blade as a courtesy.  Return in about 3 months (around 09/20/2024) for [facility].   Awanda CHARM Imperial, DPM, FACFAS [facility]     [address] [place], [place] [address]                Office [phone]  Fax [phone]

## 2024-06-26 DIAGNOSIS — R202 Paresthesia of skin: Secondary | ICD-10-CM | POA: Diagnosis not present

## 2024-06-26 DIAGNOSIS — Z720 Tobacco use: Secondary | ICD-10-CM | POA: Diagnosis not present

## 2024-06-26 DIAGNOSIS — F1190 Opioid use, unspecified, uncomplicated: Secondary | ICD-10-CM | POA: Diagnosis not present

## 2024-06-26 DIAGNOSIS — M5416 Radiculopathy, lumbar region: Secondary | ICD-10-CM | POA: Diagnosis not present

## 2024-06-26 DIAGNOSIS — E66813 Obesity, class 3: Secondary | ICD-10-CM | POA: Diagnosis not present

## 2024-06-26 DIAGNOSIS — G4700 Insomnia, unspecified: Secondary | ICD-10-CM | POA: Diagnosis not present

## 2024-06-26 DIAGNOSIS — M961 Postlaminectomy syndrome, not elsewhere classified: Secondary | ICD-10-CM | POA: Diagnosis not present

## 2024-06-26 DIAGNOSIS — M25551 Pain in right hip: Secondary | ICD-10-CM | POA: Diagnosis not present

## 2024-06-26 DIAGNOSIS — Z79891 Long term (current) use of opiate analgesic: Secondary | ICD-10-CM | POA: Diagnosis not present

## 2024-06-26 DIAGNOSIS — G8929 Other chronic pain: Secondary | ICD-10-CM | POA: Diagnosis not present

## 2024-06-26 DIAGNOSIS — M542 Cervicalgia: Secondary | ICD-10-CM | POA: Diagnosis not present

## 2024-06-26 DIAGNOSIS — Z6841 Body Mass Index (BMI) 40.0 and over, adult: Secondary | ICD-10-CM | POA: Diagnosis not present

## 2024-08-08 DIAGNOSIS — Z6841 Body Mass Index (BMI) 40.0 and over, adult: Secondary | ICD-10-CM | POA: Diagnosis not present

## 2024-08-08 DIAGNOSIS — E66813 Obesity, class 3: Secondary | ICD-10-CM | POA: Diagnosis not present

## 2024-08-08 DIAGNOSIS — Z794 Long term (current) use of insulin: Secondary | ICD-10-CM | POA: Diagnosis not present

## 2024-08-08 DIAGNOSIS — E119 Type 2 diabetes mellitus without complications: Secondary | ICD-10-CM | POA: Diagnosis not present

## 2024-09-06 DIAGNOSIS — Z794 Long term (current) use of insulin: Secondary | ICD-10-CM | POA: Diagnosis not present

## 2024-09-06 DIAGNOSIS — Z6841 Body Mass Index (BMI) 40.0 and over, adult: Secondary | ICD-10-CM | POA: Diagnosis not present

## 2024-09-06 DIAGNOSIS — E66813 Obesity, class 3: Secondary | ICD-10-CM | POA: Diagnosis not present

## 2024-09-06 DIAGNOSIS — E119 Type 2 diabetes mellitus without complications: Secondary | ICD-10-CM | POA: Diagnosis not present

## 2024-09-19 ENCOUNTER — Ambulatory Visit: Admitting: Podiatry

## 2024-09-19 DIAGNOSIS — E119 Type 2 diabetes mellitus without complications: Secondary | ICD-10-CM | POA: Diagnosis not present

## 2024-09-19 DIAGNOSIS — Z794 Long term (current) use of insulin: Secondary | ICD-10-CM | POA: Diagnosis not present

## 2024-09-26 DIAGNOSIS — D509 Iron deficiency anemia, unspecified: Secondary | ICD-10-CM | POA: Diagnosis not present

## 2024-09-26 DIAGNOSIS — E119 Type 2 diabetes mellitus without complications: Secondary | ICD-10-CM | POA: Diagnosis not present

## 2024-09-26 DIAGNOSIS — F338 Other recurrent depressive disorders: Secondary | ICD-10-CM | POA: Diagnosis not present

## 2024-09-26 DIAGNOSIS — L732 Hidradenitis suppurativa: Secondary | ICD-10-CM | POA: Diagnosis not present

## 2024-09-26 DIAGNOSIS — E559 Vitamin D deficiency, unspecified: Secondary | ICD-10-CM | POA: Diagnosis not present

## 2024-09-26 DIAGNOSIS — E785 Hyperlipidemia, unspecified: Secondary | ICD-10-CM | POA: Diagnosis not present

## 2024-09-26 DIAGNOSIS — I10 Essential (primary) hypertension: Secondary | ICD-10-CM | POA: Diagnosis not present

## 2024-09-26 DIAGNOSIS — E039 Hypothyroidism, unspecified: Secondary | ICD-10-CM | POA: Diagnosis not present

## 2024-09-26 DIAGNOSIS — Z23 Encounter for immunization: Secondary | ICD-10-CM | POA: Diagnosis not present

## 2024-10-03 ENCOUNTER — Ambulatory Visit: Admitting: Podiatry

## 2024-10-03 DIAGNOSIS — M79675 Pain in left toe(s): Secondary | ICD-10-CM | POA: Diagnosis not present

## 2024-10-03 DIAGNOSIS — M79674 Pain in right toe(s): Secondary | ICD-10-CM

## 2024-10-03 DIAGNOSIS — B351 Tinea unguium: Secondary | ICD-10-CM | POA: Diagnosis not present

## 2024-10-03 NOTE — Progress Notes (Signed)
 Nails x 10.  In power chair.  She continues to lose weight with Ozempic and is very pleased.  Her A1c was in the 4 range      Subjective:  Patient ID: Kathleen Hansen, female    DOB: January 02, 1971,  MRN: [id number]  Kathleen Hansen presents to clinic today for:  Chief Complaint  Patient presents with   Kathleen Hansen    Last A1c: 4.5, FSBS 107 this morning, takes BC powders regularly. No corn/callous care needed.    Patient notes nails are thick, discolored, elongated and painful in shoegear when trying to ambulate.   She is in a power wheelchair today.  She notes she continues to have reduction of weight with Ozempic and is pleased on how she feels.    PCP is Potts, Therisa RAMAN, NP.  Past Medical History:  Diagnosis Date   Anxiety    Arthritis    Back   Back pain    Depression    Past Surgical History:  Procedure Laterality Date   ANTERIOR LAT LUMBAR FUSION N/A 03/15/2017   Procedure: ANTERIOR LATERAL LUMBAR INTERBODY FUSION LUMBAR FOUR- LUMBAR FIVE;  Surgeon: Lanis Pupa, MD;  [facility];  Service: Neurosurgery;  Laterality: N/A;   APPENDECTOMY     APPLICATION OF ROBOTIC ASSISTANCE FOR SPINAL PROCEDURE N/A 03/15/2017   Procedure: APPLICATION OF ROBOTIC ASSISTANCE FOR SPINAL PROCEDURE;  Surgeon: Lanis Pupa, MD;  [facility];  Service: Neurosurgery;  Laterality: N/A;   CHOLECYSTECTOMY     COLONOSCOPY     DILATION AND CURETTAGE OF UTERUS     pre-cancerous cells removed from uterus   LUMBAR PERCUTANEOUS PEDICLE SCREW 1 LEVEL N/A 03/15/2017   Procedure: ROBOTIC PERCUTANEOUS PEDICLE SCREW LUMBAR FOUR- LUMBAR FIVE;  Surgeon: Lanis Pupa, MD;  [facility];  Service: Neurosurgery;  Laterality: N/A;   TUBAL LIGATION     WISDOM TOOTH EXTRACTION     No Known Allergies  Review of Systems: Negative except as noted in the HPI.  Objective:  Kathleen Hansen is a pleasant 53 y.o. female in NAD. AAO x 3.  Vascular Examination: Capillary refill time is 3-5 seconds to toes  bilateral. Palpable pedal pulses b/l LE. Digital hair present b/l.  Skin temperature gradient WNL b/l. No varicosities b/l. No cyanosis noted b/l.   Dermatological Examination: Pedal skin with normal turgor, texture and tone b/l. No open wounds. No interdigital macerations b/l. Toenails x10 are 3mm thick, discolored, dystrophic with subungual debris. There is pain with compression of the nail plates.  They are elongated x10.   Assessment/Plan: 1. Pain due to onychomycosis of toenails of both feet    The mycotic toenails were sharply debrided x10 with sterile nail nippers and a power debriding burr to decrease bulk/thickness and length.   Return in about 3 months (around 01/03/2025) for [facility].   Kathleen Hansen, DPM, FACFAS [facility]     [address] [place], [place] [address]                Office [phone]  Fax [phone]

## 2024-10-11 DIAGNOSIS — E119 Type 2 diabetes mellitus without complications: Secondary | ICD-10-CM | POA: Diagnosis not present

## 2024-10-11 DIAGNOSIS — Z794 Long term (current) use of insulin: Secondary | ICD-10-CM | POA: Diagnosis not present

## 2024-10-11 DIAGNOSIS — Z6841 Body Mass Index (BMI) 40.0 and over, adult: Secondary | ICD-10-CM | POA: Diagnosis not present

## 2024-10-11 DIAGNOSIS — E66813 Obesity, class 3: Secondary | ICD-10-CM | POA: Diagnosis not present

## 2025-01-02 ENCOUNTER — Ambulatory Visit: Admitting: Podiatry

## 2025-01-03 ENCOUNTER — Ambulatory Visit: Admitting: Podiatry
